# Patient Record
Sex: Female | Born: 1965 | Race: Black or African American | Hispanic: No | Marital: Single | Smoking: Never smoker
Health system: Southern US, Community
[De-identification: ages and names within clinical notes are randomized; demographics above are authoritative.]

## PROBLEM LIST (undated history)

## (undated) HISTORY — PX: BREAST EXCISIONAL BIOPSY: SUR124

---

## 1998-10-26 ENCOUNTER — Other Ambulatory Visit: Admission: RE | Admit: 1998-10-26 | Discharge: 1998-10-26 | Payer: Self-pay | Admitting: *Deleted

## 1999-11-24 ENCOUNTER — Other Ambulatory Visit: Admission: RE | Admit: 1999-11-24 | Discharge: 1999-11-24 | Payer: Self-pay | Admitting: *Deleted

## 2000-11-23 ENCOUNTER — Other Ambulatory Visit: Admission: RE | Admit: 2000-11-23 | Discharge: 2000-11-23 | Payer: Self-pay | Admitting: Obstetrics and Gynecology

## 2001-12-03 ENCOUNTER — Other Ambulatory Visit: Admission: RE | Admit: 2001-12-03 | Discharge: 2001-12-03 | Payer: Self-pay | Admitting: Obstetrics and Gynecology

## 2002-05-02 ENCOUNTER — Encounter: Admission: RE | Admit: 2002-05-02 | Discharge: 2002-05-02 | Payer: Self-pay | Admitting: Family Medicine

## 2002-05-02 ENCOUNTER — Encounter: Payer: Self-pay | Admitting: Family Medicine

## 2002-10-16 ENCOUNTER — Ambulatory Visit (HOSPITAL_COMMUNITY): Admission: RE | Admit: 2002-10-16 | Discharge: 2002-10-16 | Payer: Self-pay | Admitting: Family Medicine

## 2002-10-16 ENCOUNTER — Encounter: Payer: Self-pay | Admitting: Family Medicine

## 2002-11-21 ENCOUNTER — Encounter: Admission: RE | Admit: 2002-11-21 | Discharge: 2002-11-21 | Payer: Self-pay | Admitting: Family Medicine

## 2002-12-24 ENCOUNTER — Other Ambulatory Visit: Admission: RE | Admit: 2002-12-24 | Discharge: 2002-12-24 | Payer: Self-pay | Admitting: Obstetrics and Gynecology

## 2003-06-02 ENCOUNTER — Encounter: Admission: RE | Admit: 2003-06-02 | Discharge: 2003-06-02 | Payer: Self-pay | Admitting: Family Medicine

## 2004-03-17 ENCOUNTER — Encounter: Admission: RE | Admit: 2004-03-17 | Discharge: 2004-03-17 | Payer: Self-pay | Admitting: Family Medicine

## 2005-05-12 ENCOUNTER — Encounter: Admission: RE | Admit: 2005-05-12 | Discharge: 2005-05-12 | Payer: Self-pay | Admitting: Family Medicine

## 2006-06-29 ENCOUNTER — Encounter: Admission: RE | Admit: 2006-06-29 | Discharge: 2006-06-29 | Payer: Self-pay | Admitting: Family Medicine

## 2007-08-14 ENCOUNTER — Encounter: Admission: RE | Admit: 2007-08-14 | Discharge: 2007-08-14 | Payer: Self-pay | Admitting: Family Medicine

## 2008-08-21 ENCOUNTER — Encounter: Admission: RE | Admit: 2008-08-21 | Discharge: 2008-08-21 | Payer: Self-pay | Admitting: Family Medicine

## 2009-10-09 ENCOUNTER — Encounter: Admission: RE | Admit: 2009-10-09 | Discharge: 2009-10-09 | Payer: Self-pay | Admitting: Family Medicine

## 2009-12-16 ENCOUNTER — Emergency Department (HOSPITAL_COMMUNITY): Admission: EM | Admit: 2009-12-16 | Discharge: 2009-12-16 | Payer: Self-pay | Admitting: Emergency Medicine

## 2010-12-13 ENCOUNTER — Other Ambulatory Visit (HOSPITAL_COMMUNITY): Payer: Self-pay | Admitting: Family Medicine

## 2010-12-13 DIAGNOSIS — Z1231 Encounter for screening mammogram for malignant neoplasm of breast: Secondary | ICD-10-CM

## 2011-01-12 ENCOUNTER — Ambulatory Visit (HOSPITAL_COMMUNITY)
Admission: RE | Admit: 2011-01-12 | Discharge: 2011-01-12 | Disposition: A | Discharge status: Home / Self Care | Payer: BC Managed Care – PPO | Source: Ambulatory Visit | Attending: Family Medicine | Admitting: Family Medicine

## 2011-01-12 DIAGNOSIS — Z1231 Encounter for screening mammogram for malignant neoplasm of breast: Secondary | ICD-10-CM | POA: Insufficient documentation

## 2012-02-20 ENCOUNTER — Other Ambulatory Visit (HOSPITAL_COMMUNITY): Payer: Self-pay | Admitting: Family Medicine

## 2012-02-20 DIAGNOSIS — Z1231 Encounter for screening mammogram for malignant neoplasm of breast: Secondary | ICD-10-CM

## 2012-02-28 ENCOUNTER — Ambulatory Visit (HOSPITAL_COMMUNITY): Payer: BC Managed Care – PPO

## 2012-03-06 ENCOUNTER — Ambulatory Visit (HOSPITAL_COMMUNITY): Payer: BC Managed Care – PPO

## 2012-03-19 ENCOUNTER — Ambulatory Visit (HOSPITAL_COMMUNITY)
Admission: RE | Admit: 2012-03-19 | Discharge: 2012-03-19 | Disposition: A | Discharge status: Home / Self Care | Payer: BC Managed Care – PPO | Source: Ambulatory Visit | Attending: Family Medicine | Admitting: Family Medicine

## 2012-03-19 DIAGNOSIS — Z1231 Encounter for screening mammogram for malignant neoplasm of breast: Secondary | ICD-10-CM | POA: Insufficient documentation

## 2012-03-21 ENCOUNTER — Other Ambulatory Visit: Payer: Self-pay | Admitting: Family Medicine

## 2012-03-21 DIAGNOSIS — R928 Other abnormal and inconclusive findings on diagnostic imaging of breast: Secondary | ICD-10-CM

## 2012-04-05 ENCOUNTER — Ambulatory Visit
Admission: RE | Admit: 2012-04-05 | Discharge: 2012-04-05 | Disposition: A | Discharge status: Home / Self Care | Payer: BC Managed Care – PPO | Source: Ambulatory Visit | Attending: Family Medicine | Admitting: Family Medicine

## 2012-04-05 DIAGNOSIS — R928 Other abnormal and inconclusive findings on diagnostic imaging of breast: Secondary | ICD-10-CM

## 2012-12-28 ENCOUNTER — Other Ambulatory Visit: Payer: Self-pay | Admitting: Family Medicine

## 2012-12-28 DIAGNOSIS — R921 Mammographic calcification found on diagnostic imaging of breast: Secondary | ICD-10-CM

## 2012-12-31 ENCOUNTER — Other Ambulatory Visit (INDEPENDENT_AMBULATORY_CARE_PROVIDER_SITE_OTHER): Payer: Self-pay | Admitting: Surgery

## 2013-01-28 ENCOUNTER — Ambulatory Visit
Admission: RE | Admit: 2013-01-28 | Discharge: 2013-01-28 | Disposition: A | Discharge status: Home / Self Care | Payer: BC Managed Care – PPO | Source: Ambulatory Visit | Attending: Family Medicine | Admitting: Family Medicine

## 2013-01-28 DIAGNOSIS — R921 Mammographic calcification found on diagnostic imaging of breast: Secondary | ICD-10-CM

## 2013-08-28 ENCOUNTER — Other Ambulatory Visit: Payer: Self-pay | Admitting: Family Medicine

## 2013-08-28 DIAGNOSIS — R921 Mammographic calcification found on diagnostic imaging of breast: Secondary | ICD-10-CM

## 2013-09-03 ENCOUNTER — Encounter (INDEPENDENT_AMBULATORY_CARE_PROVIDER_SITE_OTHER): Payer: Self-pay

## 2013-09-03 ENCOUNTER — Ambulatory Visit
Admission: RE | Admit: 2013-09-03 | Discharge: 2013-09-03 | Disposition: A | Discharge status: Home / Self Care | Payer: BC Managed Care – PPO | Source: Ambulatory Visit | Attending: Family Medicine | Admitting: Family Medicine

## 2013-09-03 DIAGNOSIS — R921 Mammographic calcification found on diagnostic imaging of breast: Secondary | ICD-10-CM

## 2014-08-29 ENCOUNTER — Other Ambulatory Visit: Payer: Self-pay | Admitting: Family Medicine

## 2014-08-29 DIAGNOSIS — Z1231 Encounter for screening mammogram for malignant neoplasm of breast: Secondary | ICD-10-CM

## 2014-09-10 ENCOUNTER — Ambulatory Visit: Payer: Self-pay

## 2014-09-17 ENCOUNTER — Ambulatory Visit: Payer: Self-pay

## 2014-10-08 ENCOUNTER — Ambulatory Visit (INDEPENDENT_AMBULATORY_CARE_PROVIDER_SITE_OTHER): Payer: BLUE CROSS/BLUE SHIELD

## 2014-10-08 DIAGNOSIS — Z1231 Encounter for screening mammogram for malignant neoplasm of breast: Secondary | ICD-10-CM

## 2015-11-11 ENCOUNTER — Other Ambulatory Visit (HOSPITAL_BASED_OUTPATIENT_CLINIC_OR_DEPARTMENT_OTHER): Payer: Self-pay | Admitting: Family Medicine

## 2015-11-11 DIAGNOSIS — Z1231 Encounter for screening mammogram for malignant neoplasm of breast: Secondary | ICD-10-CM

## 2015-11-13 ENCOUNTER — Ambulatory Visit (INDEPENDENT_AMBULATORY_CARE_PROVIDER_SITE_OTHER): Payer: Managed Care, Other (non HMO)

## 2015-11-13 DIAGNOSIS — Z1231 Encounter for screening mammogram for malignant neoplasm of breast: Secondary | ICD-10-CM | POA: Diagnosis not present

## 2015-11-17 ENCOUNTER — Other Ambulatory Visit: Payer: Self-pay | Admitting: Family Medicine

## 2015-11-17 DIAGNOSIS — R928 Other abnormal and inconclusive findings on diagnostic imaging of breast: Secondary | ICD-10-CM

## 2015-11-23 ENCOUNTER — Ambulatory Visit
Admission: RE | Admit: 2015-11-23 | Discharge: 2015-11-23 | Disposition: A | Discharge status: Home / Self Care | Payer: Managed Care, Other (non HMO) | Source: Ambulatory Visit | Attending: Family Medicine | Admitting: Family Medicine

## 2015-11-23 ENCOUNTER — Ambulatory Visit: Admission: RE | Admit: 2015-11-23 | Payer: Managed Care, Other (non HMO) | Source: Ambulatory Visit

## 2015-11-23 ENCOUNTER — Other Ambulatory Visit: Payer: Self-pay | Admitting: Family Medicine

## 2015-11-23 DIAGNOSIS — R928 Other abnormal and inconclusive findings on diagnostic imaging of breast: Secondary | ICD-10-CM

## 2015-11-23 DIAGNOSIS — N6310 Unspecified lump in the right breast, unspecified quadrant: Secondary | ICD-10-CM

## 2015-11-25 HISTORY — PX: BREAST BIOPSY: SHX20

## 2015-11-26 ENCOUNTER — Ambulatory Visit
Admission: RE | Admit: 2015-11-26 | Discharge: 2015-11-26 | Disposition: A | Discharge status: Home / Self Care | Payer: Managed Care, Other (non HMO) | Source: Ambulatory Visit | Attending: Family Medicine | Admitting: Family Medicine

## 2015-11-26 ENCOUNTER — Other Ambulatory Visit: Payer: Self-pay | Admitting: Family Medicine

## 2015-11-26 DIAGNOSIS — N6310 Unspecified lump in the right breast, unspecified quadrant: Secondary | ICD-10-CM

## 2016-03-22 ENCOUNTER — Ambulatory Visit
Admission: RE | Admit: 2016-03-22 | Discharge: 2016-03-22 | Disposition: A | Discharge status: Home / Self Care | Payer: Managed Care, Other (non HMO) | Source: Ambulatory Visit | Attending: Family Medicine | Admitting: Family Medicine

## 2016-03-22 ENCOUNTER — Other Ambulatory Visit: Payer: Self-pay | Admitting: Family Medicine

## 2016-03-22 DIAGNOSIS — R059 Cough, unspecified: Secondary | ICD-10-CM

## 2016-03-22 DIAGNOSIS — R05 Cough: Secondary | ICD-10-CM

## 2016-06-03 ENCOUNTER — Ambulatory Visit (INDEPENDENT_AMBULATORY_CARE_PROVIDER_SITE_OTHER): Payer: Managed Care, Other (non HMO) | Admitting: Internal Medicine

## 2016-06-03 DIAGNOSIS — R05 Cough: Secondary | ICD-10-CM | POA: Diagnosis not present

## 2016-06-03 DIAGNOSIS — R053 Chronic cough: Secondary | ICD-10-CM | POA: Insufficient documentation

## 2016-06-03 LAB — NITRIC OXIDE: Nitric Oxide: 18

## 2016-06-03 NOTE — Patient Instructions (Signed)
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  - any problems call or come sooner - at followup if still probelmatic we can consider speech rehab or neurontin or cough research protocol

## 2016-06-03 NOTE — Progress Notes (Signed)
Subjective:    Patient ID: Sharon Boyer, female    DOB: 1966/01/12, 51 y.o.   MRN: [id number]  PCP Merri BrunetteSmith, Candace, MD   HPI  IOV 06/03/2016  Chief Complaint  Patient presents with  . Pulmonary Consult    Referring Dr. Severiano Gilbertandice Smith patient is having a on going cough.    Sharon Boyer 1966/01/12 [address]j [address]  [place] [id number] has been referred for evaluation of chronic cough. Patient tells me that in May 2017 she suffered from a respiratory infection and since then cough. Cough is severe and dry and she made this appointment but in the past 2 weeks cough suddenly improed and cough is now mild. Cough made worse by talking (she works in [employer] at [employer]) and improved by being quiet, and drinking water.No allergies. No ace ihnhibitor use. No wheee. No dyspnea. RSI cough score 1 month average is 23 but 1 weeks average is 5. She feels much better and wonder why she is here. Acording to the primary care physician he' had chronic couh that has not reslved with TessalonPerles, allergy mdicatins , hydrocodone cough syrup , Flonase , oral anti-allergy tablets. She denies GERD. She is inclined to very simple Rx measures only    exhaled nitric oxide today's 18 ppb and normal   She had chest -ray 03/18/2016 that I personlly visualized and is clear (CT chest 2004 - clear)   cough related past medical history -  Primary carr notes from 04/27/2016 reviewed primary care physician as tated that patienthas a istory of dssion and anxety o whi hisn Zooft in the past and alprazolam. Primary care physician is noted history of hyprtension patient being on atenolol is not on ACE inhibitor   lab values from 04/27/2016 shows a creatinine of 0.8 Mg percen abnormal liver function tests    Dr Gretta CoolKouffman Reflux Symptom Index (> 13-15 suggestive of LPR cough) 0 -> 5  =  none ->severe problem  Hoarseness of problem with voice 4  Clearing  Of Throat 4  Excess throat mucus or feeling  of post nasal drip 4  Difficulty swallowing food, liquid or tablets 0  Cough after eating or lying down 4  Breathing difficulties or choking episodes 1  Troublesome or annoying cough 4  Sensation of something sticking in throat or lump in throat 0  Heartburn, chest pain, indigestion, or stomach acid coming up 2  TOTAL 23      has no past medical history on file.   has no tobacco history on file.  No past surgical history on file.  No Known Allergies  Immunization History  Administered Date(s) Administered  . Influenza, High Dose Seasonal PF 10/25/2015    No family history on file.   Current Outpatient Prescriptions:  .  atenolol-chlorthalidone (TENORETIC) 50-25 MG tablet, 1 TABLET ONCE A DAY ORALLY 30 DAYS, Disp: , Rfl: 6 .  benzonatate (TESSALON) 100 MG capsule, TAKE 1 TO 2 CAPSULES BY MOUTH EVERY 8 TO 12 HOURS AS NEEDED FOR COUGH, Disp: , Rfl: 1 .  KLOR-CON 8 MEQ tablet, TAKE 3 TABLETS EVERY DAY, Disp: , Rfl: 1 .  loratadine (CLARITIN) 10 MG tablet, 1 TABLET ONCE A DAY ORALLY 30 DAY(S), Disp: , Rfl: 0 .  zolpidem (AMBIEN) 10 MG tablet, Take 10 mg by mouth at bedtime., Disp: , Rfl: 5   Review of Systems  Constitutional: Negative for fever and unexpected weight change.  HENT: Negative for congestion, dental problem, ear  pain, nosebleeds, postnasal drip, rhinorrhea, sinus pressure, sneezing, sore throat and trouble swallowing.   Eyes: Negative for redness and itching.  Respiratory: Positive for cough. Negative for chest tightness, shortness of breath and wheezing.   Cardiovascular: Negative for palpitations and leg swelling.  Gastrointestinal: Negative for nausea and vomiting.  Genitourinary: Negative for dysuria.  Musculoskeletal: Negative for joint swelling.  Skin: Negative for rash.  Allergic/Immunologic: Negative.  Negative for environmental allergies, food allergies and immunocompromised state.  Neurological: Negative for headaches.  Hematological: Does not  bruise/bleed easily.  Psychiatric/Behavioral: Negative for dysphoric mood. The patient is not nervous/anxious.        Objective:   Physical Exam  Constitutional: She is oriented to person, place, and time. She appears well-developed and well-nourished. No distress.  HENT:  Head: Normocephalic and atraumatic.  Right Ear: External ear normal.  Left Ear: External ear normal.  Mouth/Throat: Oropharynx is clear and moist. No oropharyngeal exudate.  Eyes: Conjunctivae and EOM are normal. Pupils are equal, round, and reactive to light. Right eye exhibits no discharge. Left eye exhibits no discharge. No scleral icterus.  Neck: Normal range of motion. Neck supple. No JVD present. No tracheal deviation present. No thyromegaly present.  Cardiovascular: Normal rate, regular rhythm, normal heart sounds and intact distal pulses.  Exam reveals no gallop and no friction rub.   No murmur heard. Pulmonary/Chest: Effort normal and breath sounds normal. No respiratory distress. She has no wheezes. She has no rales. She exhibits no tenderness.  Abdominal: Soft. Bowel sounds are normal. She exhibits no distension and no mass. There is no tenderness. There is no rebound and no guarding.  Musculoskeletal: Normal range of motion. She exhibits no edema or tenderness.  Lymphadenopathy:    She has no cervical adenopathy.  Neurological: She is alert and oriented to person, place, and time. She has normal reflexes. No cranial nerve deficit. She exhibits normal muscle tone. Coordination normal.  Skin: Skin is warm and dry. No rash noted. She is not diaphoretic. No erythema. No pallor.  Psychiatric: She has a normal mood and affect. Her behavior is normal. Judgment and thought content normal.  Vitals reviewed.   Vitals:   06/03/16 1410  BP: 140/82  Pulse: 100  Resp: 16  SpO2: 100%  Weight: 115 lb (52.2 kg)  Height: 5\' 1"  (1.549 m)    Estimated body mass index is 21.73 kg/m as calculated from the following:    Height as of this encounter: 5\' 1"  (1.549 m).   Weight as of this encounter: 115 lb (52.2 kg).       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  - any problems call or come sooner - at followup if still probelmatic we can consider speech rehab or neurontin or cough research protocol     Dr. Kalman Shan, M.D., [facility].C.P Pulmonary and Critical Care Medicine Staff Physician [facility] Pager: [phone], If no answer or between  15:00h - 7:00h: call [phone]  06/03/2016 2:46 PM

## 2016-06-03 NOTE — Assessment & Plan Note (Signed)
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  - any problems call or come sooner - at followup if still probelmatic we can consider speech rehab or neurontin or cough research protocol

## 2016-08-18 ENCOUNTER — Ambulatory Visit: Payer: Managed Care, Other (non HMO) | Admitting: Internal Medicine

## 2016-10-12 ENCOUNTER — Ambulatory Visit: Payer: Managed Care, Other (non HMO) | Admitting: Internal Medicine

## 2016-11-08 ENCOUNTER — Other Ambulatory Visit: Payer: Self-pay | Admitting: Family Medicine

## 2016-11-08 DIAGNOSIS — Z1231 Encounter for screening mammogram for malignant neoplasm of breast: Secondary | ICD-10-CM

## 2016-11-10 ENCOUNTER — Other Ambulatory Visit: Payer: Self-pay | Admitting: Family Medicine

## 2016-11-10 DIAGNOSIS — N644 Mastodynia: Secondary | ICD-10-CM

## 2016-11-18 ENCOUNTER — Ambulatory Visit
Admission: RE | Admit: 2016-11-18 | Discharge: 2016-11-18 | Disposition: A | Discharge status: Home / Self Care | Payer: Managed Care, Other (non HMO) | Source: Ambulatory Visit | Attending: Family Medicine | Admitting: Family Medicine

## 2016-11-18 DIAGNOSIS — N644 Mastodynia: Secondary | ICD-10-CM

## 2016-12-05 ENCOUNTER — Ambulatory Visit: Payer: Managed Care, Other (non HMO) | Admitting: Internal Medicine

## 2018-01-01 ENCOUNTER — Other Ambulatory Visit: Payer: Self-pay | Admitting: Family Medicine

## 2018-01-01 DIAGNOSIS — Z1231 Encounter for screening mammogram for malignant neoplasm of breast: Secondary | ICD-10-CM

## 2018-02-09 ENCOUNTER — Ambulatory Visit
Admission: RE | Admit: 2018-02-09 | Discharge: 2018-02-09 | Disposition: A | Discharge status: Home / Self Care | Payer: BLUE CROSS/BLUE SHIELD | Source: Ambulatory Visit | Attending: Family Medicine | Admitting: Family Medicine

## 2018-02-09 DIAGNOSIS — Z1231 Encounter for screening mammogram for malignant neoplasm of breast: Secondary | ICD-10-CM | POA: Diagnosis not present

## 2018-02-15 DIAGNOSIS — R252 Cramp and spasm: Secondary | ICD-10-CM | POA: Diagnosis not present

## 2018-03-15 DIAGNOSIS — Z1211 Encounter for screening for malignant neoplasm of colon: Secondary | ICD-10-CM | POA: Diagnosis not present

## 2018-03-15 DIAGNOSIS — R7303 Prediabetes: Secondary | ICD-10-CM | POA: Diagnosis not present

## 2018-03-15 DIAGNOSIS — I10 Essential (primary) hypertension: Secondary | ICD-10-CM | POA: Diagnosis not present

## 2018-03-15 DIAGNOSIS — G4700 Insomnia, unspecified: Secondary | ICD-10-CM | POA: Diagnosis not present

## 2018-03-15 DIAGNOSIS — F419 Anxiety disorder, unspecified: Secondary | ICD-10-CM | POA: Diagnosis not present

## 2018-08-30 DIAGNOSIS — F419 Anxiety disorder, unspecified: Secondary | ICD-10-CM | POA: Diagnosis not present

## 2018-08-30 DIAGNOSIS — I10 Essential (primary) hypertension: Secondary | ICD-10-CM | POA: Diagnosis not present

## 2018-08-30 DIAGNOSIS — G4700 Insomnia, unspecified: Secondary | ICD-10-CM | POA: Diagnosis not present

## 2018-08-30 DIAGNOSIS — R7303 Prediabetes: Secondary | ICD-10-CM | POA: Diagnosis not present

## 2018-08-30 DIAGNOSIS — Z0000 Encounter for general adult medical examination without abnormal findings: Secondary | ICD-10-CM | POA: Diagnosis not present

## 2019-03-15 ENCOUNTER — Other Ambulatory Visit: Payer: Self-pay | Admitting: Family Medicine

## 2019-03-15 DIAGNOSIS — Z1231 Encounter for screening mammogram for malignant neoplasm of breast: Secondary | ICD-10-CM

## 2019-03-25 DIAGNOSIS — G4700 Insomnia, unspecified: Secondary | ICD-10-CM | POA: Diagnosis not present

## 2019-03-25 DIAGNOSIS — F419 Anxiety disorder, unspecified: Secondary | ICD-10-CM | POA: Diagnosis not present

## 2019-03-25 DIAGNOSIS — R7303 Prediabetes: Secondary | ICD-10-CM | POA: Diagnosis not present

## 2019-03-25 DIAGNOSIS — I10 Essential (primary) hypertension: Secondary | ICD-10-CM | POA: Diagnosis not present

## 2019-04-12 ENCOUNTER — Ambulatory Visit: Payer: BC Managed Care – PPO | Admitting: Podiatry

## 2019-04-15 ENCOUNTER — Ambulatory Visit
Admission: RE | Admit: 2019-04-15 | Discharge: 2019-04-15 | Disposition: A | Discharge status: Home / Self Care | Payer: BC Managed Care – PPO | Source: Ambulatory Visit | Attending: Family Medicine | Admitting: Family Medicine

## 2019-04-15 ENCOUNTER — Other Ambulatory Visit: Payer: Self-pay

## 2019-04-15 DIAGNOSIS — Z1231 Encounter for screening mammogram for malignant neoplasm of breast: Secondary | ICD-10-CM | POA: Diagnosis not present

## 2019-04-16 ENCOUNTER — Ambulatory Visit (INDEPENDENT_AMBULATORY_CARE_PROVIDER_SITE_OTHER): Payer: BC Managed Care – PPO | Admitting: Sports Medicine

## 2019-04-16 ENCOUNTER — Encounter: Payer: Self-pay | Admitting: Sports Medicine

## 2019-04-16 DIAGNOSIS — M79675 Pain in left toe(s): Secondary | ICD-10-CM | POA: Diagnosis not present

## 2019-04-16 DIAGNOSIS — B351 Tinea unguium: Secondary | ICD-10-CM

## 2019-04-16 DIAGNOSIS — L608 Other nail disorders: Secondary | ICD-10-CM | POA: Diagnosis not present

## 2019-04-16 DIAGNOSIS — M79674 Pain in right toe(s): Secondary | ICD-10-CM

## 2019-04-16 DIAGNOSIS — B359 Dermatophytosis, unspecified: Secondary | ICD-10-CM | POA: Diagnosis not present

## 2019-04-16 DIAGNOSIS — L601 Onycholysis: Secondary | ICD-10-CM | POA: Diagnosis not present

## 2019-04-16 MED ORDER — CLOTRIMAZOLE 1 % EX SOLN: 1.0000 "application " | Freq: Two times a day (BID) | CUTANEOUS | 5 refills | Status: DC

## 2019-04-16 MED ORDER — LAMISIL AT SPRAY 1 % EX SOLN: CUTANEOUS | 1 refills | Status: DC

## 2019-04-16 NOTE — Progress Notes (Signed)
Subjective: Sharon Boyer is a 54 y.o. female patient seen today in office with complaint of mildly painful thickened and discolored nails. Patient is desiring treatment for nail changes; has tried OTC topicals/home remedies and Vicks vapor rub in the past with no improvement however does admit that she has not been consistent with any of these remedies. Reports that nails are becoming difficult to manage because of the thickness and scaling skin to both feet that has been going on for several months/year. Patient has no other pedal complaints at this time.  Review of Systems  All other systems reviewed and are negative.    Patient Active Problem List   Diagnosis Date Noted  . Chronic cough 06/03/2016    Current Outpatient Medications on File Prior to Visit  Medication Sig Dispense Refill  . ALPRAZolam (XANAX) 0.25 MG tablet Take 0.25 mg by mouth 2 (two) times daily as needed.    Marland Kitchen amLODipine (NORVASC) 10 MG tablet Take 10 mg by mouth daily.    Marland Kitchen atenolol-chlorthalidone (TENORETIC) 50-25 MG tablet 1 TABLET ONCE A DAY ORALLY 30 DAYS  6  . carvedilol (COREG) 12.5 MG tablet Take 12.5 mg by mouth 2 (two) times daily.    Marland Kitchen loratadine (CLARITIN) 10 MG tablet 1 TABLET ONCE A DAY ORALLY 30 DAY(S)  0  . olmesartan (BENICAR) 40 MG tablet Take 40 mg by mouth daily.    Marland Kitchen zolpidem (AMBIEN) 10 MG tablet Take 10 mg by mouth at bedtime.  5  . benzonatate (TESSALON) 100 MG capsule TAKE 1 TO 2 CAPSULES BY MOUTH EVERY 8 TO 12 HOURS AS NEEDED FOR COUGH  1  . KLOR-CON 8 MEQ tablet TAKE 3 TABLETS EVERY DAY  1   No current facility-administered medications on file prior to visit.    No Known Allergies  Objective: Physical Exam  General: Well developed, nourished, no acute distress, awake, alert and oriented x 3  Vascular: Dorsalis pedis artery 2/4 bilateral, Posterior tibial artery 2/4 bilateral, skin temperature warm to warm proximal to distal bilateral lower extremities, no varicosities, pedal  hair present bilateral.  Neurological: Gross sensation present via light touch bilateral.   Dermatological: Skin is warm, dry, and supple bilateral, Nails 1-10 are tender, short thick, and discolored with mild subungal debris with bilateral hallux nail most involved with distal lifting, + fourth webspace macerations present bilateral, scaly skin to use plantar surfaces of both feet, no open lesions present bilateral, no callus/corns/hyperkeratotic tissue present bilateral. No other signs of infection bilateral.  Musculoskeletal: Asymptomatic hammertoe boney deformities noted bilateral. Muscular strength within normal limits without painon range of motion. No pain with calf compression bilateral.  Assessment and Plan:  Problem List Items Addressed This Visit    None    Visit Diagnoses    Toenail fungus    -  Primary   Relevant Medications   clotrimazole (LOTRIMIN) 1 % external solution   Terbinafine HCl (LAMISIL AT SPRAY) 1 % SOLN   Other Relevant Orders   Fungus culture w smear   Toe pain, bilateral       Tinea       Relevant Medications   clotrimazole (LOTRIMIN) 1 % external solution   Terbinafine HCl (LAMISIL AT SPRAY) 1 % SOLN      -Examined patient -Discussed treatment options for painful dystrophic nails and tinea skin -Prescribed clotrimazole solution to use in between toes twice daily and Lamisil spray to use at bedtime -Fungal culture was obtained by removing a portion of  the hard nail itself from each of the involved toenails using a sterile nail nipper and sent to [facility] lab. Patient tolerated the biopsy procedure well without discomfort or need for anesthesia.  -Advised good hygiene habits and to dry well in between toes to prevent excessive moisture -Patient to return in 4 weeks for follow up evaluation and discussion of fungal culture results and medication check or sooner if symptoms worsen.  Landis Martins, DPM

## 2019-05-14 ENCOUNTER — Other Ambulatory Visit: Payer: Self-pay | Admitting: Sports Medicine

## 2019-05-14 ENCOUNTER — Other Ambulatory Visit: Payer: Self-pay

## 2019-05-14 ENCOUNTER — Ambulatory Visit (INDEPENDENT_AMBULATORY_CARE_PROVIDER_SITE_OTHER): Payer: BC Managed Care – PPO | Admitting: Sports Medicine

## 2019-05-14 ENCOUNTER — Encounter: Payer: Self-pay | Admitting: Sports Medicine

## 2019-05-14 VITALS — Temp 97.7°F

## 2019-05-14 DIAGNOSIS — M79675 Pain in left toe(s): Secondary | ICD-10-CM | POA: Diagnosis not present

## 2019-05-14 DIAGNOSIS — B359 Dermatophytosis, unspecified: Secondary | ICD-10-CM

## 2019-05-14 DIAGNOSIS — M79674 Pain in right toe(s): Secondary | ICD-10-CM | POA: Diagnosis not present

## 2019-05-14 DIAGNOSIS — B351 Tinea unguium: Secondary | ICD-10-CM

## 2019-05-14 LAB — HEPATIC FUNCTION PANEL
AG Ratio: 1.5 (calc) (ref 1.0–2.5)
ALT: 14 U/L (ref 6–29)
AST: 16 U/L (ref 10–35)
Albumin: 4.7 g/dL (ref 3.6–5.1)
Alkaline phosphatase (APISO): 101 U/L (ref 37–153)
Bilirubin, Direct: 0.1 mg/dL (ref 0.0–0.2)
Globulin: 3.2 g/dL (calc) (ref 1.9–3.7)
Indirect Bilirubin: 0.3 mg/dL (calc) (ref 0.2–1.2)
Total Bilirubin: 0.4 mg/dL (ref 0.2–1.2)
Total Protein: 7.9 g/dL (ref 6.1–8.1)

## 2019-05-14 MED ORDER — TERBINAFINE HCL 250 MG PO TABS: 250.0000 mg | ORAL_TABLET | Freq: Every day | ORAL | 0 refills | Status: DC

## 2019-05-14 NOTE — Progress Notes (Signed)
LFT normal Lamisil sent to pharmacy _Dr. Marylene Land

## 2019-05-14 NOTE — Progress Notes (Signed)
  Subjective: Sharon Boyer is a 54 y.o. female patient seen today in office for fungal culture results. Patient has no other pedal complaints at this time.   Patient Active Problem List   Diagnosis Date Noted  . Chronic cough 06/03/2016    Current Outpatient Medications on File Prior to Visit  Medication Sig Dispense Refill  . ALPRAZolam (XANAX) 0.25 MG tablet Take 0.25 mg by mouth 2 (two) times daily as needed.    Marland Kitchen amLODipine (NORVASC) 10 MG tablet Take 10 mg by mouth daily.    . carvedilol (COREG) 12.5 MG tablet Take 12.5 mg by mouth 2 (two) times daily.    . clotrimazole (LOTRIMIN) 1 % external solution Apply 1 application topically 2 (two) times daily. In between toes 60 mL 5  . olmesartan (BENICAR) 40 MG tablet Take 40 mg by mouth daily.    . Terbinafine HCl (LAMISIL AT SPRAY) 1 % SOLN Spray daily to the tops and bottoms of both feet at bedtime 125 mL 1  . zolpidem (AMBIEN) 10 MG tablet Take 10 mg by mouth at bedtime.  5   No current facility-administered medications on file prior to visit.    No Known Allergies  Objective: Physical Exam  General: Well developed, nourished, no acute distress, awake, alert and oriented x 3   Vascular: Dorsalis pedis artery 2/4 bilateral, Posterior tibial artery 2/4 bilateral, skin temperature warm to warm proximal to distal bilateral lower extremities, no varicosities, pedal hair present bilateral.   Neurological: Gross sensation present via light touch bilateral.    Dermatological: Skin is warm, dry, and supple bilateral, Nails 1-10 are tender, short thick, and discolored with mild subungal debris with bilateral hallux nail most involved with distal lifting, there is less involvement bilateral third toenails. + fourth webspace macerations present bilateral, scaly skin to use plantar surfaces of both feet that appears to be much improved, no open lesions present bilateral, no callus/corns/hyperkeratotic tissue present bilateral. No other  signs of infection bilateral.   Musculoskeletal: Asymptomatic hammertoe boney deformities noted bilateral. Muscular strength within normal limits without painon range of motion. No pain with calf compression bilateral.    Fungal culture positive significant for T rubrum and Pseudomonas and evidence of microtrauma  Assessment and Plan:  Problem List Items Addressed This Visit    None    Visit Diagnoses    Nail fungus    -  Primary   Relevant Orders   Hepatic Function Panel   Toenail fungus       Toe pain, bilateral       Tinea          -Examined patient -Discussed treatment options for painful mycotic nails -Patient opt for oral Lamisil with full understanding of medication risks; ordered LFTs for review if within normal limits will proceed with sending Rx to pharmacy for lamisil 250mg  PO daily. Anticipate 12 week course.  -Advised good hygiene habits and good supportive shoes daily for foot type -Patient to return in 6 weeks for follow up evaluation or sooner if symptoms worsen.  , DPM

## 2019-06-12 DIAGNOSIS — L509 Urticaria, unspecified: Secondary | ICD-10-CM | POA: Diagnosis not present

## 2019-06-25 ENCOUNTER — Ambulatory Visit (INDEPENDENT_AMBULATORY_CARE_PROVIDER_SITE_OTHER): Payer: BC Managed Care – PPO | Admitting: Sports Medicine

## 2019-06-25 ENCOUNTER — Other Ambulatory Visit: Payer: Self-pay

## 2019-06-25 ENCOUNTER — Encounter: Payer: Self-pay | Admitting: Sports Medicine

## 2019-06-25 VITALS — Temp 97.2°F

## 2019-06-25 DIAGNOSIS — M79674 Pain in right toe(s): Secondary | ICD-10-CM | POA: Diagnosis not present

## 2019-06-25 DIAGNOSIS — B359 Dermatophytosis, unspecified: Secondary | ICD-10-CM

## 2019-06-25 DIAGNOSIS — B351 Tinea unguium: Secondary | ICD-10-CM | POA: Diagnosis not present

## 2019-06-25 DIAGNOSIS — M79675 Pain in left toe(s): Secondary | ICD-10-CM

## 2019-06-25 LAB — HEPATIC FUNCTION PANEL
AG Ratio: 1.5 (calc) (ref 1.0–2.5)
ALT: 15 U/L (ref 6–29)
AST: 15 U/L (ref 10–35)
Albumin: 4.8 g/dL (ref 3.6–5.1)
Alkaline phosphatase (APISO): 92 U/L (ref 37–153)
Bilirubin, Direct: 0.1 mg/dL (ref 0.0–0.2)
Globulin: 3.1 g/dL (calc) (ref 1.9–3.7)
Indirect Bilirubin: 0.3 mg/dL (calc) (ref 0.2–1.2)
Total Bilirubin: 0.4 mg/dL (ref 0.2–1.2)
Total Protein: 7.9 g/dL (ref 6.1–8.1)

## 2019-06-25 NOTE — Progress Notes (Signed)
Subjective: Sharon Boyer is a 54 y.o. female patient seen today in office for follow up evaluation of nail fungus on Lamisil. Patient states that she is doing well with no adverse reaction, starting to see that the nails do not look as dark. Patient has no other pedal complaints at this time.   Patient Active Problem List   Diagnosis Date Noted   Chronic cough 06/03/2016    Current Outpatient Medications on File Prior to Visit  Medication Sig Dispense Refill   ALPRAZolam (XANAX) 0.25 MG tablet Take 0.25 mg by mouth 2 (two) times daily as needed.     amLODipine (NORVASC) 10 MG tablet Take 10 mg by mouth daily.     carvedilol (COREG) 12.5 MG tablet Take 12.5 mg by mouth 2 (two) times daily.     clotrimazole (LOTRIMIN) 1 % external solution Apply 1 application topically 2 (two) times daily. In between toes 60 mL 5   olmesartan (BENICAR) 40 MG tablet Take 40 mg by mouth daily.     terbinafine (LAMISIL) 250 MG tablet Take 1 tablet (250 mg total) by mouth daily. 90 tablet 0   Terbinafine HCl (LAMISIL AT SPRAY) 1 % SOLN Spray daily to the tops and bottoms of both feet at bedtime 125 mL 1   zolpidem (AMBIEN) 10 MG tablet Take 10 mg by mouth at bedtime.  5   No current facility-administered medications on file prior to visit.    No Known Allergies  Objective: Physical Exam  General: Well developed, nourished, no acute distress, awake, alert and oriented x 3  Vascular: Dorsalis pedis artery 2/4 bilateral, Posterior tibial artery 2/4 bilateral, skin temperature warm to warm proximal to distal bilateral lower extremities, no varicosities, pedal hair present bilateral.  Neurological: Gross sensation present via light touch bilateral.   Dermatological: Skin is warm, dry, and supple bilateral, Nails 1-10 are tender, short thick, and discolored with mild subungal debris and early clearance noted at proximal nail bed, resolved webspace macerations present bilateral, + scaly skin, no  open lesions present bilateral, no callus/corns/hyperkeratotic tissue present bilateral. No signs of infection bilateral.  Musculoskeletal: Asymptomatic  Pes planus boney deformities noted bilateral. Muscular strength within normal limits without painon range of motion. No pain with calf compression bilateral.  Assessment and Plan:  Problem List Items Addressed This Visit    None    Visit Diagnoses    Nail fungus    -  Primary   Relevant Orders   Hepatic Function Panel   Toe pain, bilateral       Tinea         -Examined patient -Cont with Lamisil; a new set of LFTs were ordered; will call patient to stop medication if abnormal  -Cont Lamisil spray to both feet and in between toes  -Advised good hygiene habits and educated patient on proper foot care to prevent re-infection -Patient to return in 6 weeks for follow up evaluation or sooner if symptoms worsen.  Asencion Islam, DPM

## 2019-08-06 ENCOUNTER — Other Ambulatory Visit: Payer: Self-pay

## 2019-08-06 ENCOUNTER — Ambulatory Visit (INDEPENDENT_AMBULATORY_CARE_PROVIDER_SITE_OTHER): Payer: BC Managed Care – PPO | Admitting: Sports Medicine

## 2019-08-06 ENCOUNTER — Encounter: Payer: Self-pay | Admitting: Sports Medicine

## 2019-08-06 DIAGNOSIS — B351 Tinea unguium: Secondary | ICD-10-CM | POA: Diagnosis not present

## 2019-08-06 DIAGNOSIS — M79674 Pain in right toe(s): Secondary | ICD-10-CM

## 2019-08-06 DIAGNOSIS — B359 Dermatophytosis, unspecified: Secondary | ICD-10-CM | POA: Diagnosis not present

## 2019-08-06 DIAGNOSIS — M79675 Pain in left toe(s): Secondary | ICD-10-CM

## 2019-08-06 NOTE — Progress Notes (Signed)
Subjective: Sharon Boyer is a 54 y.o. female patient seen today in office for follow up evaluation of nail fungus on Lamisil. Patient states that she is doing well with no adverse reaction but did have one episode of hives but thinks it may be a reaction to something else and is going to see dermatologist. Patient has no other pedal complaints at this time.   Patient Active Problem List   Diagnosis Date Noted   Chronic cough 06/03/2016    Current Outpatient Medications on File Prior to Visit  Medication Sig Dispense Refill   ALPRAZolam (XANAX) 0.25 MG tablet Take 0.25 mg by mouth 2 (two) times daily as needed.     amLODipine (NORVASC) 10 MG tablet Take 10 mg by mouth daily.     carvedilol (COREG) 12.5 MG tablet Take 12.5 mg by mouth 2 (two) times daily.     clotrimazole (LOTRIMIN) 1 % external solution Apply 1 application topically 2 (two) times daily. In between toes 60 mL 5   olmesartan (BENICAR) 40 MG tablet Take 40 mg by mouth daily.     predniSONE (DELTASONE) 20 MG tablet Take by mouth.     terbinafine (LAMISIL) 250 MG tablet Take 1 tablet (250 mg total) by mouth daily. 90 tablet 0   Terbinafine HCl (LAMISIL AT SPRAY) 1 % SOLN Spray daily to the tops and bottoms of both feet at bedtime 125 mL 1   zolpidem (AMBIEN) 10 MG tablet Take 10 mg by mouth at bedtime.  5   No current facility-administered medications on file prior to visit.    No Known Allergies  Objective: Physical Exam  General: Well developed, nourished, no acute distress, awake, alert and oriented x 3  Vascular: Dorsalis pedis artery 2/4 bilateral, Posterior tibial artery 2/4 bilateral, skin temperature warm to warm proximal to distal bilateral lower extremities, no varicosities, pedal hair present bilateral.  Neurological: Gross sensation present via light touch bilateral.   Dermatological: Skin is warm, dry, and supple bilateral, Nails 1-10 are tender, short thick, and discolored with mild subungal  debris and early clearance noted at proximal nail bed especially bilateral hallux, resolved webspace macerations present bilateral, + scaly skin much improved from prior, no open lesions present bilateral, no callus/corns/hyperkeratotic tissue present bilateral. No signs of infection bilateral.  Musculoskeletal: Asymptomatic  Pes planus boney deformities noted bilateral. Muscular strength within normal limits without painon range of motion. No pain with calf compression bilateral.  Assessment and Plan:  Problem List Items Addressed This Visit    None    Visit Diagnoses    Nail fungus    -  Primary   Toe pain, bilateral       Tinea         -Examined patient -Cont with Lamisil until completed no additional blood work is needed since patient will be finished within a week with the medication -Cont Lamisil spray to both feet and in between toes like previous -Advised good hygiene habits and educated patient on proper foot care to prevent re-infection -Advised topical tea tree oil or biotin -Patient to return as needed or sooner if symptoms worsen.  Asencion Islam, DPM

## 2019-09-03 DIAGNOSIS — I10 Essential (primary) hypertension: Secondary | ICD-10-CM | POA: Diagnosis not present

## 2019-09-03 DIAGNOSIS — Z0000 Encounter for general adult medical examination without abnormal findings: Secondary | ICD-10-CM | POA: Diagnosis not present

## 2019-09-03 DIAGNOSIS — R7303 Prediabetes: Secondary | ICD-10-CM | POA: Diagnosis not present

## 2019-09-03 DIAGNOSIS — Z23 Encounter for immunization: Secondary | ICD-10-CM | POA: Diagnosis not present

## 2019-09-03 DIAGNOSIS — F419 Anxiety disorder, unspecified: Secondary | ICD-10-CM | POA: Diagnosis not present

## 2019-09-03 DIAGNOSIS — E7800 Pure hypercholesterolemia, unspecified: Secondary | ICD-10-CM | POA: Diagnosis not present

## 2019-12-16 DIAGNOSIS — S46911A Strain of unspecified muscle, fascia and tendon at shoulder and upper arm level, right arm, initial encounter: Secondary | ICD-10-CM | POA: Diagnosis not present

## 2020-02-05 IMAGING — MG DIGITAL SCREENING BILATERAL MAMMOGRAM WITH TOMO AND CAD
8 series · 9 of 24 positions shown · non-contrast
Comparison: Previous exam(s).

CLINICAL DATA: Screening.

EXAM:
DIGITAL SCREENING BILATERAL MAMMOGRAM WITH TOMO AND CAD

[L CC synth-2D]
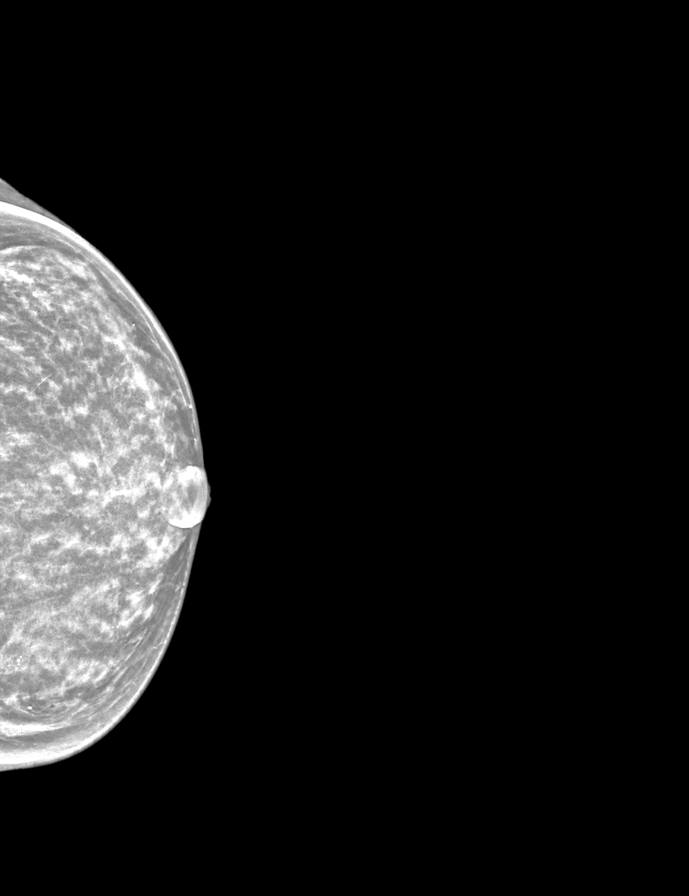

[R MLO synth-2D]
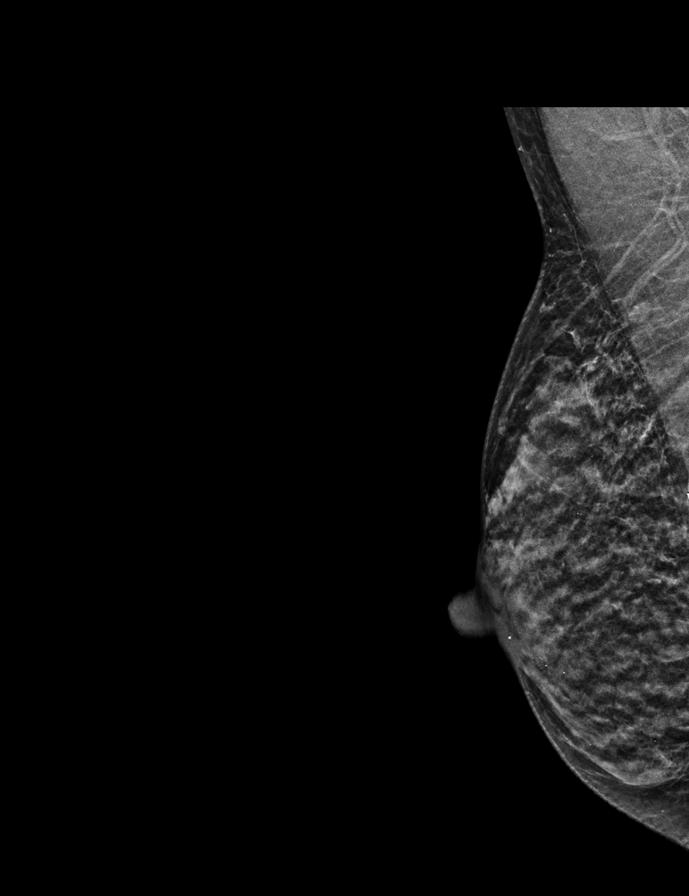

[R CC synth-2D]
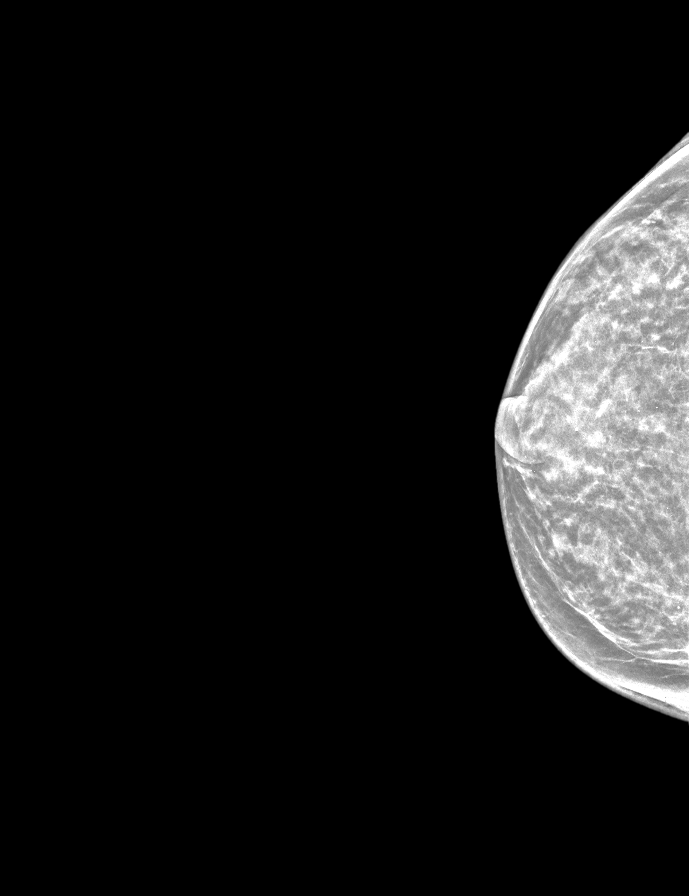

[L MLO synth-2D]
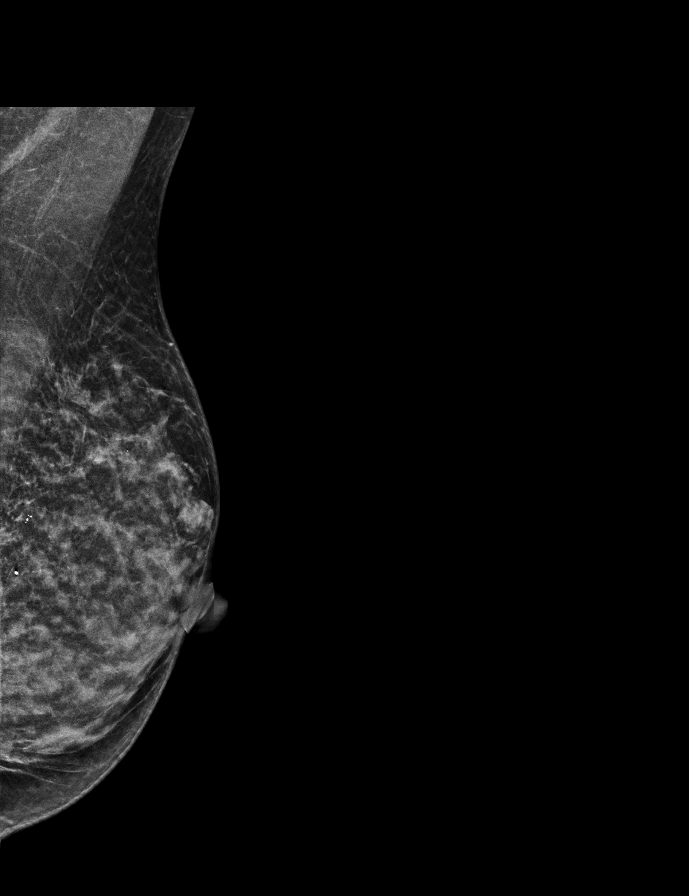

[R MLO tomo · 2 of 38 frames shown]
[frame 13/38]
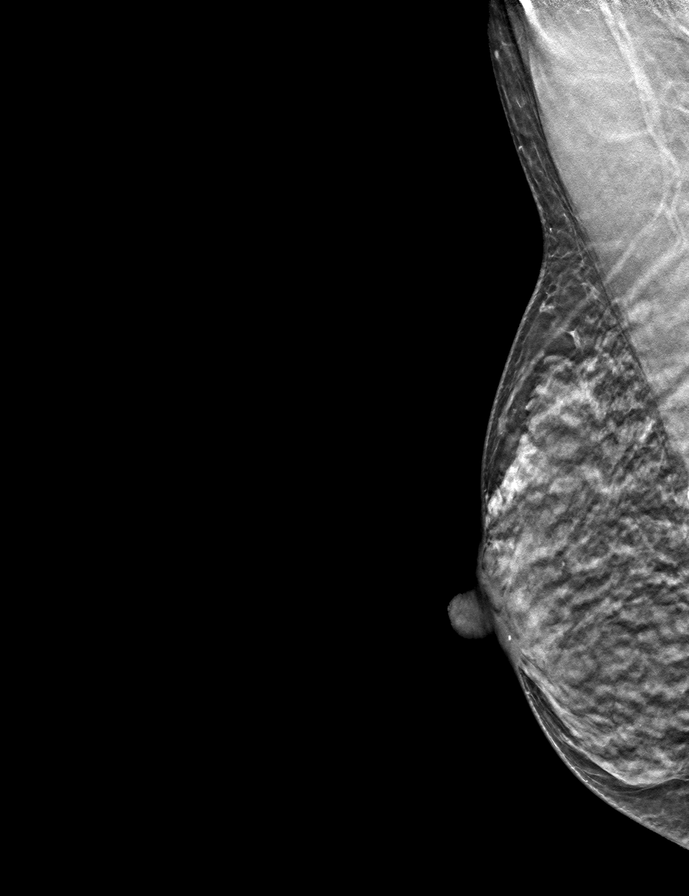
[frame 19/38]
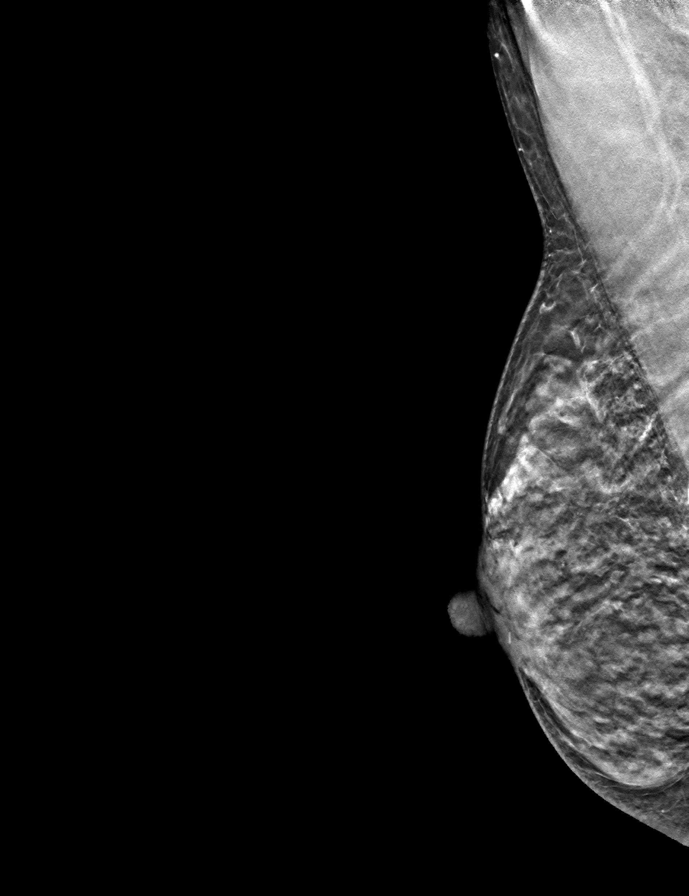

[L CC tomo · tomo slice 19/38.0]
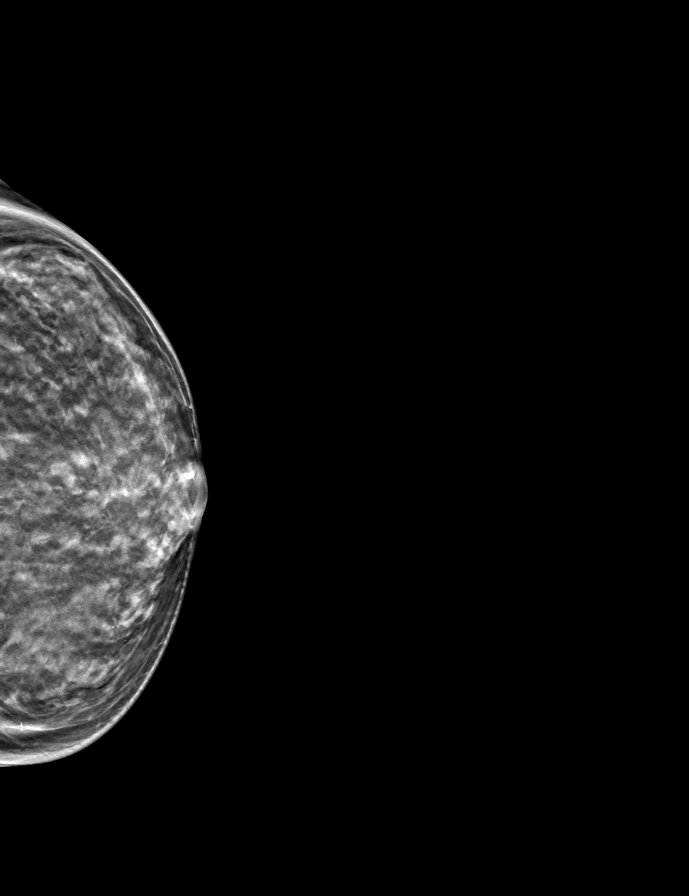

[L MLO tomo · tomo slice 21/40.0]
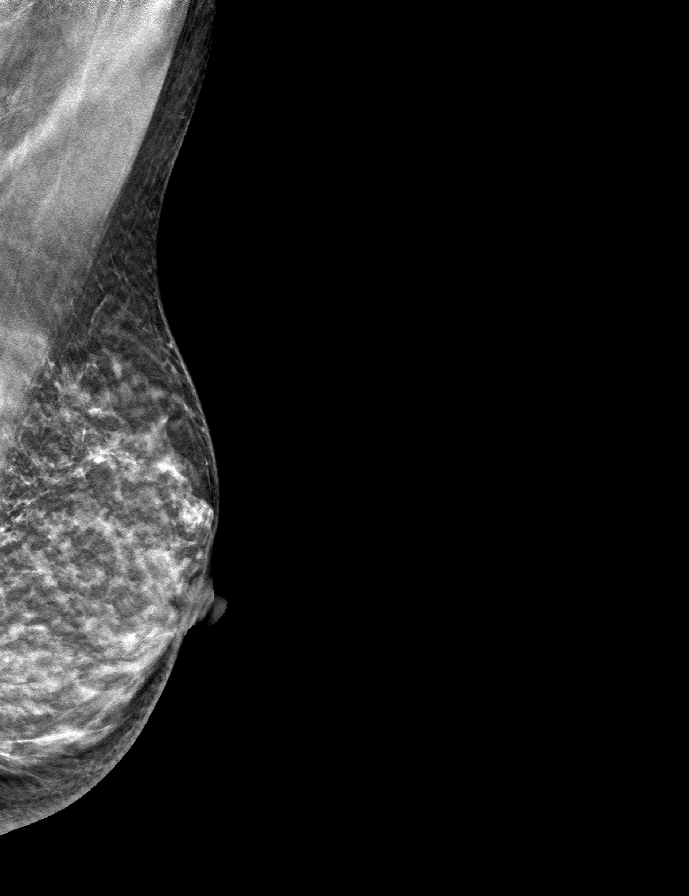

[R CC tomo · tomo slice 20/39.0]
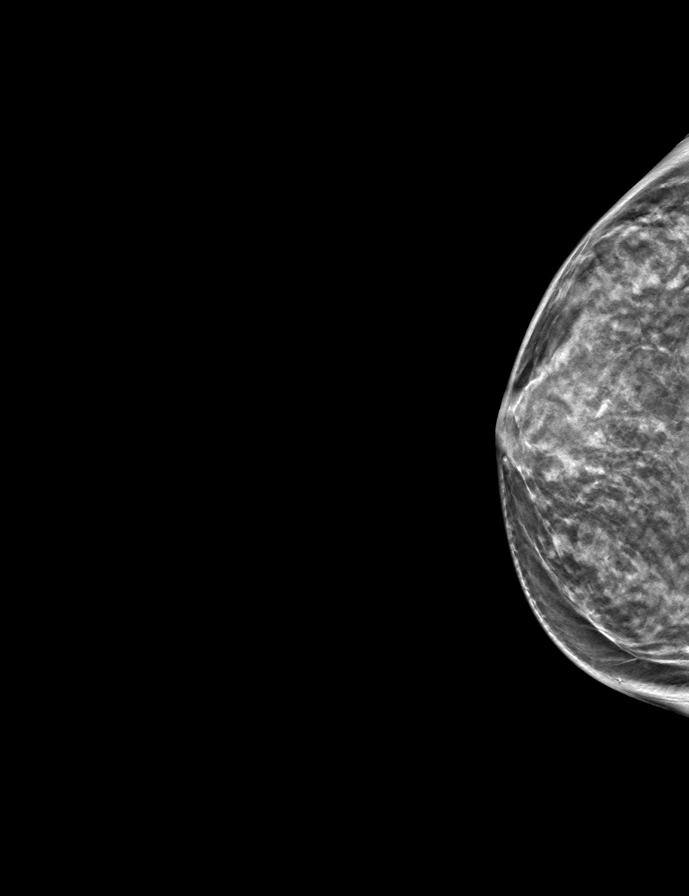

[9 of 24 positions shown; findings below may reference images not displayed]

ACR Breast Density Category d: The breast tissue is extremely dense,
which lowers the sensitivity of mammography
FINDINGS: There are no findings suspicious for malignancy. Images were
processed with CAD.
IMPRESSION: No mammographic evidence of malignancy. A result letter of this
screening mammogram will be mailed directly to the patient.

RECOMMENDATION:
Screening mammogram in one year. (Code:WO-0-ZI0)

BI-RADS CATEGORY  1: Negative.

## 2020-03-26 DIAGNOSIS — G479 Sleep disorder, unspecified: Secondary | ICD-10-CM | POA: Diagnosis not present

## 2020-03-26 DIAGNOSIS — R7303 Prediabetes: Secondary | ICD-10-CM | POA: Diagnosis not present

## 2020-03-26 DIAGNOSIS — F419 Anxiety disorder, unspecified: Secondary | ICD-10-CM | POA: Diagnosis not present

## 2020-03-26 DIAGNOSIS — I10 Essential (primary) hypertension: Secondary | ICD-10-CM | POA: Diagnosis not present

## 2020-04-16 DIAGNOSIS — M7062 Trochanteric bursitis, left hip: Secondary | ICD-10-CM | POA: Diagnosis not present

## 2020-05-13 DIAGNOSIS — M7501 Adhesive capsulitis of right shoulder: Secondary | ICD-10-CM | POA: Diagnosis not present

## 2020-05-22 ENCOUNTER — Other Ambulatory Visit: Payer: Self-pay | Admitting: Family Medicine

## 2020-05-22 DIAGNOSIS — Z1231 Encounter for screening mammogram for malignant neoplasm of breast: Secondary | ICD-10-CM

## 2020-06-17 DIAGNOSIS — Z113 Encounter for screening for infections with a predominantly sexual mode of transmission: Secondary | ICD-10-CM | POA: Diagnosis not present

## 2020-06-17 DIAGNOSIS — Z124 Encounter for screening for malignant neoplasm of cervix: Secondary | ICD-10-CM | POA: Diagnosis not present

## 2020-06-17 DIAGNOSIS — Z6821 Body mass index (BMI) 21.0-21.9, adult: Secondary | ICD-10-CM | POA: Diagnosis not present

## 2020-06-17 DIAGNOSIS — Z01419 Encounter for gynecological examination (general) (routine) without abnormal findings: Secondary | ICD-10-CM | POA: Diagnosis not present

## 2020-07-20 ENCOUNTER — Ambulatory Visit
Admission: RE | Admit: 2020-07-20 | Discharge: 2020-07-20 | Disposition: A | Discharge status: Home / Self Care | Payer: BC Managed Care – PPO | Source: Ambulatory Visit | Attending: Family Medicine | Admitting: Family Medicine

## 2020-07-20 ENCOUNTER — Other Ambulatory Visit: Payer: Self-pay

## 2020-07-20 DIAGNOSIS — Z1231 Encounter for screening mammogram for malignant neoplasm of breast: Secondary | ICD-10-CM

## 2020-08-06 DIAGNOSIS — F411 Generalized anxiety disorder: Secondary | ICD-10-CM | POA: Diagnosis not present

## 2020-09-16 DIAGNOSIS — I10 Essential (primary) hypertension: Secondary | ICD-10-CM | POA: Diagnosis not present

## 2020-09-16 DIAGNOSIS — R7301 Impaired fasting glucose: Secondary | ICD-10-CM | POA: Diagnosis not present

## 2020-09-16 DIAGNOSIS — E7800 Pure hypercholesterolemia, unspecified: Secondary | ICD-10-CM | POA: Diagnosis not present

## 2020-09-16 DIAGNOSIS — G4700 Insomnia, unspecified: Secondary | ICD-10-CM | POA: Diagnosis not present

## 2020-09-16 DIAGNOSIS — Z0000 Encounter for general adult medical examination without abnormal findings: Secondary | ICD-10-CM | POA: Diagnosis not present

## 2021-04-05 DIAGNOSIS — E7800 Pure hypercholesterolemia, unspecified: Secondary | ICD-10-CM | POA: Diagnosis not present

## 2021-04-05 DIAGNOSIS — R7303 Prediabetes: Secondary | ICD-10-CM | POA: Diagnosis not present

## 2021-04-05 DIAGNOSIS — F419 Anxiety disorder, unspecified: Secondary | ICD-10-CM | POA: Diagnosis not present

## 2021-04-05 DIAGNOSIS — I10 Essential (primary) hypertension: Secondary | ICD-10-CM | POA: Diagnosis not present

## 2021-06-17 DIAGNOSIS — R42 Dizziness and giddiness: Secondary | ICD-10-CM | POA: Diagnosis not present

## 2021-08-03 DIAGNOSIS — F419 Anxiety disorder, unspecified: Secondary | ICD-10-CM | POA: Diagnosis not present

## 2021-08-03 DIAGNOSIS — I10 Essential (primary) hypertension: Secondary | ICD-10-CM | POA: Diagnosis not present

## 2021-08-03 DIAGNOSIS — Z0000 Encounter for general adult medical examination without abnormal findings: Secondary | ICD-10-CM | POA: Diagnosis not present

## 2021-08-03 DIAGNOSIS — Z1159 Encounter for screening for other viral diseases: Secondary | ICD-10-CM | POA: Diagnosis not present

## 2021-08-03 DIAGNOSIS — R7303 Prediabetes: Secondary | ICD-10-CM | POA: Diagnosis not present

## 2021-08-03 DIAGNOSIS — E7800 Pure hypercholesterolemia, unspecified: Secondary | ICD-10-CM | POA: Diagnosis not present

## 2021-08-30 ENCOUNTER — Other Ambulatory Visit: Payer: Self-pay | Admitting: Family Medicine

## 2021-08-30 DIAGNOSIS — Z1231 Encounter for screening mammogram for malignant neoplasm of breast: Secondary | ICD-10-CM

## 2021-09-09 ENCOUNTER — Ambulatory Visit
Admission: RE | Admit: 2021-09-09 | Discharge: 2021-09-09 | Disposition: A | Discharge status: Home / Self Care | Payer: BC Managed Care – PPO | Source: Ambulatory Visit | Attending: Family Medicine | Admitting: Family Medicine

## 2021-09-09 DIAGNOSIS — Z1231 Encounter for screening mammogram for malignant neoplasm of breast: Secondary | ICD-10-CM

## 2021-09-13 ENCOUNTER — Other Ambulatory Visit: Payer: Self-pay | Admitting: Family Medicine

## 2021-09-13 DIAGNOSIS — R928 Other abnormal and inconclusive findings on diagnostic imaging of breast: Secondary | ICD-10-CM

## 2021-09-21 ENCOUNTER — Ambulatory Visit
Admission: RE | Admit: 2021-09-21 | Discharge: 2021-09-21 | Disposition: A | Discharge status: Home / Self Care | Payer: BC Managed Care – PPO | Source: Ambulatory Visit | Attending: Family Medicine | Admitting: Family Medicine

## 2021-09-21 ENCOUNTER — Other Ambulatory Visit: Payer: Self-pay | Admitting: Family Medicine

## 2021-09-21 DIAGNOSIS — R928 Other abnormal and inconclusive findings on diagnostic imaging of breast: Secondary | ICD-10-CM

## 2021-09-21 DIAGNOSIS — R922 Inconclusive mammogram: Secondary | ICD-10-CM | POA: Diagnosis not present

## 2021-09-21 DIAGNOSIS — N6323 Unspecified lump in the left breast, lower outer quadrant: Secondary | ICD-10-CM | POA: Diagnosis not present

## 2021-09-21 DIAGNOSIS — N6324 Unspecified lump in the left breast, lower inner quadrant: Secondary | ICD-10-CM | POA: Diagnosis not present

## 2021-09-21 DIAGNOSIS — N6320 Unspecified lump in the left breast, unspecified quadrant: Secondary | ICD-10-CM

## 2021-09-22 ENCOUNTER — Ambulatory Visit
Admission: RE | Admit: 2021-09-22 | Discharge: 2021-09-22 | Disposition: A | Discharge status: Home / Self Care | Payer: BC Managed Care – PPO | Source: Ambulatory Visit | Attending: Family Medicine | Admitting: Family Medicine

## 2021-09-22 DIAGNOSIS — N6012 Diffuse cystic mastopathy of left breast: Secondary | ICD-10-CM | POA: Diagnosis not present

## 2021-09-22 DIAGNOSIS — N6320 Unspecified lump in the left breast, unspecified quadrant: Secondary | ICD-10-CM

## 2021-09-22 DIAGNOSIS — N6323 Unspecified lump in the left breast, lower outer quadrant: Secondary | ICD-10-CM | POA: Diagnosis not present

## 2022-01-01 DIAGNOSIS — J101 Influenza due to other identified influenza virus with other respiratory manifestations: Secondary | ICD-10-CM | POA: Diagnosis not present

## 2022-01-01 DIAGNOSIS — Z03818 Encounter for observation for suspected exposure to other biological agents ruled out: Secondary | ICD-10-CM | POA: Diagnosis not present

## 2022-01-01 DIAGNOSIS — J029 Acute pharyngitis, unspecified: Secondary | ICD-10-CM | POA: Diagnosis not present

## 2022-01-01 DIAGNOSIS — R52 Pain, unspecified: Secondary | ICD-10-CM | POA: Diagnosis not present

## 2022-01-01 DIAGNOSIS — R509 Fever, unspecified: Secondary | ICD-10-CM | POA: Diagnosis not present

## 2022-02-03 DIAGNOSIS — R7303 Prediabetes: Secondary | ICD-10-CM | POA: Diagnosis not present

## 2022-02-03 DIAGNOSIS — I10 Essential (primary) hypertension: Secondary | ICD-10-CM | POA: Diagnosis not present

## 2022-02-03 DIAGNOSIS — G4700 Insomnia, unspecified: Secondary | ICD-10-CM | POA: Diagnosis not present

## 2022-02-03 DIAGNOSIS — F411 Generalized anxiety disorder: Secondary | ICD-10-CM | POA: Diagnosis not present

## 2022-02-28 ENCOUNTER — Ambulatory Visit
Admission: RE | Admit: 2022-02-28 | Discharge: 2022-02-28 | Disposition: A | Discharge status: Home / Self Care | Payer: BC Managed Care – PPO | Source: Ambulatory Visit | Attending: Family Medicine | Admitting: Family Medicine

## 2022-02-28 ENCOUNTER — Other Ambulatory Visit: Payer: Self-pay | Admitting: Family Medicine

## 2022-02-28 DIAGNOSIS — R053 Chronic cough: Secondary | ICD-10-CM

## 2022-02-28 DIAGNOSIS — J208 Acute bronchitis due to other specified organisms: Secondary | ICD-10-CM | POA: Diagnosis not present

## 2022-03-14 DIAGNOSIS — R8290 Unspecified abnormal findings in urine: Secondary | ICD-10-CM | POA: Diagnosis not present

## 2022-03-14 DIAGNOSIS — R300 Dysuria: Secondary | ICD-10-CM | POA: Diagnosis not present

## 2022-03-14 DIAGNOSIS — N898 Other specified noninflammatory disorders of vagina: Secondary | ICD-10-CM | POA: Diagnosis not present

## 2022-03-23 DIAGNOSIS — R053 Chronic cough: Secondary | ICD-10-CM | POA: Diagnosis not present

## 2022-03-23 DIAGNOSIS — M545 Low back pain, unspecified: Secondary | ICD-10-CM | POA: Diagnosis not present

## 2022-04-18 DIAGNOSIS — R0789 Other chest pain: Secondary | ICD-10-CM | POA: Diagnosis not present

## 2022-04-18 DIAGNOSIS — R053 Chronic cough: Secondary | ICD-10-CM | POA: Diagnosis not present

## 2022-04-27 DIAGNOSIS — R0789 Other chest pain: Secondary | ICD-10-CM | POA: Diagnosis not present

## 2022-04-27 DIAGNOSIS — R059 Cough, unspecified: Secondary | ICD-10-CM | POA: Diagnosis not present

## 2022-05-13 ENCOUNTER — Ambulatory Visit (INDEPENDENT_AMBULATORY_CARE_PROVIDER_SITE_OTHER): Payer: BC Managed Care – PPO | Admitting: Pulmonary Disease

## 2022-05-13 ENCOUNTER — Encounter: Payer: Self-pay | Admitting: Pulmonary Disease

## 2022-05-13 VITALS — BP 118/64 | Ht 61.0 in | Wt 113.0 lb

## 2022-05-13 DIAGNOSIS — R053 Chronic cough: Secondary | ICD-10-CM | POA: Diagnosis not present

## 2022-05-13 NOTE — Progress Notes (Signed)
  ID: Sharon Boyer, female    DOB: [birth year], 57 y.o.   MRN: [id number]  Chief Complaint  Patient presents with   Consult    Pt is here for consult for cough. Pt states that the cough is better now. Pt states that cough was present for about 3-4 months and before it occurred she had the flu.     Referring provider: Tally Joe, MD  HPI:   57 y.o. woman whom we are seeing in evaluation of chronic cough.  Most recent PCP note reviewed.  Most recent pulmonary note 2018 Dr. Marchelle Gearing reviewed.  Previous history of chronic cough.  Does not seem like it lasted more in a couple months.  Some concern for reflux versus cyclical cough.  Advised voice rest etc.  Seem like they have gotten better.  Lasting in pulmonary clinic in 2018 and was an issue for many years.  She got flu December 2023.  Got better from acute setting send a few weeks later cough returns.  Largely dry.  No time of day when things are better or worse per the position to make things better or worse.  Tried multiple different things medication wise Tessalon Perles etc. cough syrup did not help.  Was placed on PPI and symptoms started to get better.  She is not sure if it was just getting better on its own it was got better with that.  She been off PPI for a few weeks and cough remains okay.  Has not used inhalers.  No prednisone.  Most recent chest x-ray 03/02/2022 reviewed interpret as clear lungs with hyperinflation on both the PA and lateral views.  On my review interpretation of prior most recent chest x-ray 2018 this is unchanged.    Questionaires / Pulmonary Flowsheets:   ACT:      No data to display          MMRC:     No data to display          Epworth:      No data to display          Tests:   FENO:  Lab Results  Component Value Date   NITRICOXIDE 18 06/03/2016    PFT:     No data to display          WALK:      No data to display          Imaging: Personally  reviewed and as per EMR discussion this note No results found.  Lab Results: Personally reviewed CBC No results found for: "WBC", "RBC", "HGB", "HCT", "PLT", "MCV", "MCH", "MCHC", "RDW", "LYMPHSABS", "MONOABS", "EOSABS", "BASOSABS"  BMET No results found for: "NA", "K", "CL", "CO2", "GLUCOSE", "BUN", "CREATININE", "CALCIUM", "GFRNONAA", "GFRAA"  BNP No results found for: "BNP"  ProBNP No results found for: "PROBNP"  Specialty Problems       Pulmonary Problems   Chronic cough    No Known Allergies  Immunization History  Administered Date(s) Administered   Influenza, High Dose Seasonal PF 10/25/2015    History reviewed. No pertinent past medical history.  Tobacco History: Social History   Tobacco Use  Smoking Status Never  Smokeless Tobacco Never   Counseling given: Not Answered   Continue to not smoke  Outpatient Encounter Medications as of 05/13/2022  Medication Sig   ALPRAZolam (XANAX) 0.25 MG tablet Take 0.25 mg by mouth 2 (two) times daily as needed.   amLODipine (NORVASC) 10 MG tablet Take 10  mg by mouth daily.   carvedilol (COREG) 12.5 MG tablet Take 12.5 mg by mouth 2 (two) times daily.   clotrimazole (LOTRIMIN) 1 % external solution Apply 1 application topically 2 (two) times daily. In between toes   olmesartan (BENICAR) 40 MG tablet Take 40 mg by mouth daily.   predniSONE (DELTASONE) 20 MG tablet Take by mouth.   terbinafine (LAMISIL) 250 MG tablet Take 1 tablet (250 mg total) by mouth daily.   Terbinafine HCl (LAMISIL AT SPRAY) 1 % SOLN Spray daily to the tops and bottoms of both feet at bedtime   zolpidem (AMBIEN) 10 MG tablet Take 10 mg by mouth at bedtime.   No facility-administered encounter medications on file as of 05/13/2022.     Review of Systems  Review of Systems  No chest pain exertion.  No orthopnea or PND.  Comprehensive review of systems otherwise negative. Physical Exam  BP 118/64 (BP Location: Left Arm, Patient Position:  Sitting, Cuff Size: Normal)   Ht  (1.549 m)   Wt 113 lb (51.3 kg)   LMP 01/06/2011   BMI 21.35 kg/m   Wt Readings from Last 5 Encounters:  05/13/22 113 lb (51.3 kg)  06/03/16 115 lb (52.2 kg)    BMI Readings from Last 5 Encounters:  05/13/22 21.35 kg/m  06/03/16 21.73 kg/m     Physical Exam General: Sitting in chair, no acute distress Eyes: EOMI, no icterus Neck: Supple, no JVP Pulmonary: Clear, normal work of breathing Cardiovascular: Warm, no edema Abdomen: Nondistended, Bowel sounds present MSK: No synovitis, no joint effusion Neuro: Normal gait, no weakness Psych: Normal mood, full affect   Assessment & Plan:   Chronic cough: Present for 3 to 4 months.  Following flu infection.  Did get better timelines with initiation of PPI.  She has hyperinflated lungs as well.  High suspicion for reactive airways disease, likely worsened or triggered by viral illness.  Improving overall.  Nearly gone.  Given improvement in symptoms, no further workup or treatment today.  Consider trial of inhalers, ICS/LABA in the future versus resumption of PPI.  Hyperinflation on chest x-ray: Never smoker.  Suspect related to subclinical chronic asthma with flares of asthma symptoms with prolonged cough, prolonged bronchitis symptoms.  Return if symptoms worsen or fail to improve.   Karren Burly, MD 05/13/2022   This appointment required 58 minutes of patient care (this includes precharting, chart review, review of results, face-to-face care, etc.).

## 2022-05-13 NOTE — Patient Instructions (Signed)
Nice to meet you  I am glad the cough is better  Sometimes it is hard to pinpoint a cause of cough, it seems like things got better with the acid medicine but I am not totally convinced that reflux was the issue.  Your chest x-ray is clear but is a little bit hyperinflated but sometimes can be a subtle sign of asthma.  Oftentimes asthma can flare intermittently and have long periods of time where it is not a problem.  It is common for viral illness like the flu the trigger asthma-like symptoms and cough.  If the cough were to come back in the future we could consider retrying the acid medicine versus trying an inhaler to see if that helps.  Return to clinic as needed

## 2022-05-24 ENCOUNTER — Ambulatory Visit: Payer: BC Managed Care – PPO | Admitting: Pulmonary Disease

## 2022-05-24 ENCOUNTER — Telehealth: Payer: Self-pay | Admitting: Pulmonary Disease

## 2022-05-24 MED ORDER — FLUTICASONE-SALMETEROL 250-50 MCG/ACT IN AEPB: 1.0000 | INHALATION_SPRAY | Freq: Two times a day (BID) | RESPIRATORY_TRACT | 3 refills | Status: DC

## 2022-05-24 NOTE — Telephone Encounter (Signed)
Patient seen in clinic 05/13/22.  For chronic cough that was were improving.  After flu infection.  Has worsened in the interim.  Watery eyes runny nose.  Allergy-like symptoms.  Hyperinflation on prior chest x-ray.  High suspicion for asthma as discussed at last visit.  Given just seen recently, called patient and asked if she would be okay trying initial therapy ICS/LABA therapy as discussed in my clinic note 4/19.  Without a visit today.  She stated yes.  She denies any fever.  Denies any significant dyspnea.  Just some near vomiting and shortness of breath while she coughs.  Otherwise doing okay.  Will send in Advair discus 250 dose 1 puff twice a day.  Rinse out mouth with water after every use.  Instructed to call or send a message if is too expensive and I can look for more cost effective solution.  From the staff, please cancel appointment for afternoon 05/24/2022 and reschedule for sometime in the next 2 to 4 weeks with myself.

## 2022-05-24 NOTE — Telephone Encounter (Signed)
CMA has rescheduled appointment to 5/16 @245PM  with Dr. Judeth Horn. Nothing further needed.

## 2022-06-09 ENCOUNTER — Encounter: Payer: Self-pay | Admitting: Pulmonary Disease

## 2022-06-09 ENCOUNTER — Ambulatory Visit (INDEPENDENT_AMBULATORY_CARE_PROVIDER_SITE_OTHER): Payer: BC Managed Care – PPO | Admitting: Pulmonary Disease

## 2022-06-09 VITALS — BP 132/70 | HR 91 | Ht 61.0 in | Wt 111.0 lb

## 2022-06-09 DIAGNOSIS — R053 Chronic cough: Secondary | ICD-10-CM | POA: Diagnosis not present

## 2022-06-09 MED ORDER — PREDNISONE 20 MG PO TABS: ORAL_TABLET | ORAL | 0 refills | Status: AC

## 2022-06-09 MED ORDER — BREZTRI AEROSPHERE 160-9-4.8 MCG/ACT IN AERO: 2.0000 | INHALATION_SPRAY | Freq: Two times a day (BID) | RESPIRATORY_TRACT | 0 refills | Status: DC

## 2022-06-09 MED ORDER — BREZTRI AEROSPHERE 160-9-4.8 MCG/ACT IN AERO: 2.0000 | INHALATION_SPRAY | Freq: Two times a day (BID) | RESPIRATORY_TRACT | 11 refills | Status: DC

## 2022-06-09 NOTE — Progress Notes (Signed)
@Patient  ID: Sharon Boyer, female    DOB: 05/10/1965, 57 y.o.   MRN: [id number]  Chief Complaint  Patient presents with   Follow-up    1 mo f/u for cough. States the cough has not changed since last visit. Still unable to produce any phlegm.     Referring provider: Merri Brunette, MD  HPI:   57 y.o. woman whom we are seeing in evaluation of chronic cough.  Returns for routine follow-up.  Initially cough with been improving.  No further workup recommended.  She called a couple weeks later and cough and returned.  Prescribed mid dose Advair discus.  Has not helped at all.  Mostly dry cough.  Occasionally clear phlegm.  Again with hyperinflated chest x-ray asthma highly suspicious.  Discussed role and rationale for prednisone therapy given prolonged symptoms despite initial inhaler therapy.  Discussed role and rationale for escalating inhaler therapy.  HPI at initial visit: Previous history of chronic cough.  Does not seem like it lasted more in a couple months.  Some concern for reflux versus cyclical cough.  Advised voice rest etc.  Seem like they have gotten better.  Lasting in pulmonary clinic in 2018 and was an issue for many years.  She got flu December 2023.  Got better from acute setting send a few weeks later cough returns.  Largely dry.  No time of day when things are better or worse per the position to make things better or worse.  Tried multiple different things medication wise Tessalon Perles etc. cough syrup did not help.  Was placed on PPI and symptoms started to get better.  She is not sure if it was just getting better on its own it was got better with that.  She been off PPI for a few weeks and cough remains okay.  Has not used inhalers.  No prednisone.  Most recent chest x-ray 03/02/2022 reviewed interpret as clear lungs with hyperinflation on both the PA and lateral views.  On my review interpretation of prior most recent chest x-ray 2018 this is  unchanged.    Questionaires / Pulmonary Flowsheets:   ACT:      No data to display           MMRC:     No data to display           Epworth:      No data to display           Tests:   FENO:  Lab Results  Component Value Date   NITRICOXIDE 18 06/03/2016    PFT:     No data to display           WALK:      No data to display           Imaging: Personally reviewed and as per EMR discussion this note No results found.  Lab Results: Personally reviewed CBC No results found for: "WBC", "RBC", "HGB", "HCT", "PLT", "MCV", "MCH", "MCHC", "RDW", "LYMPHSABS", "MONOABS", "EOSABS", "BASOSABS"  BMET No results found for: "NA", "K", "CL", "CO2", "GLUCOSE", "BUN", "CREATININE", "CALCIUM", "GFRNONAA", "GFRAA"  BNP No results found for: "BNP"  ProBNP No results found for: "PROBNP"  Specialty Problems       Pulmonary Problems   Chronic cough    No Known Allergies  Immunization History  Administered Date(s) Administered   Influenza, High Dose Seasonal PF 10/25/2015    History reviewed. No pertinent past medical history.  Tobacco History: Social History  Tobacco Use  Smoking Status Never  Smokeless Tobacco Never   Counseling given: Not Answered   Continue to not smoke  Outpatient Encounter Medications as of 06/09/2022  Medication Sig   ALPRAZolam (XANAX) 0.25 MG tablet Take 0.25 mg by mouth 2 (two) times daily as needed.   amLODipine (NORVASC) 10 MG tablet Take 10 mg by mouth daily.   carvedilol (COREG) 12.5 MG tablet Take 12.5 mg by mouth 2 (two) times daily.   clotrimazole (LOTRIMIN) 1 % external solution Apply 1 application topically 2 (two) times daily. In between toes   fluticasone-salmeterol (ADVAIR) 250-50 MCG/ACT AEPB Inhale 1 puff into the lungs in the morning and at bedtime.   olmesartan (BENICAR) 40 MG tablet Take 40 mg by mouth daily.   predniSONE (DELTASONE) 20 MG tablet Take by mouth.   predniSONE (DELTASONE) 20  MG tablet Take 2 tablets (40 mg total) by mouth daily with breakfast for 5 days, THEN 1 tablet (20 mg total) daily with breakfast for 5 days.   terbinafine (LAMISIL) 250 MG tablet Take 1 tablet (250 mg total) by mouth daily.   Terbinafine HCl (LAMISIL AT SPRAY) 1 % SOLN Spray daily to the tops and bottoms of both feet at bedtime   zolpidem (AMBIEN) 10 MG tablet Take 10 mg by mouth at bedtime.   No facility-administered encounter medications on file as of 06/09/2022.     Review of Systems  Review of Systems  N/a Physical Exam  BP 132/70   Pulse 91   Ht 5\' 1"  (1.549 m)   Wt 111 lb (50.3 kg)   LMP 01/06/2011   SpO2 100% Comment: on RA  BMI 20.97 kg/m   Wt Readings from Last 5 Encounters:  06/09/22 111 lb (50.3 kg)  05/13/22 113 lb (51.3 kg)  06/03/16 115 lb (52.2 kg)    BMI Readings from Last 5 Encounters:  06/09/22 20.97 kg/m  05/13/22 21.35 kg/m  06/03/16 21.73 kg/m     Physical Exam General: Sitting in chair, no acute distress Eyes: EOMI, no icterus Neck: Supple, no JVP Pulmonary: Clear, normal work of breathing Cardiovascular: Warm, no edema Abdomen: Nondistended, Bowel sounds present MSK: No synovitis, no joint effusion Neuro: Normal gait, no weakness Psych: Normal mood, full affect   Assessment & Plan:   Chronic cough: Present for months.  Following flu infection.  Did get better timelines with initiation of PPI.  She has hyperinflated lungs as well.  High suspicion for reactive airways disease, likely worsened or triggered by viral illness.  Initially improved spontaneously.  Now returned.  No better despite trial of mid dose Advair discus.  Given cough is primary complaint would prefer to avoid further DPI therapy.  Escalate to three 2 puff twice daily.  Samples today.  Will prescribe, if too expensive can look for alternatives in the future.  Prednisone taper today as well given prolonged symptoms.  Hyperinflation on chest x-ray: Never smoker.  Suspect  related to subclinical chronic asthma with flares of asthma symptoms with prolonged cough, prolonged bronchitis symptoms.  Return in about 2 months (around 08/09/2022).   Karren Burly, MD 06/09/2022

## 2022-06-09 NOTE — Patient Instructions (Signed)
I am sorry the Advair did not help  Take prednisone as prescribed 40 mg for 5 days and 20 mg for 5 days then stop  Stop Advair, use Breztri 2 puffs twice a day every day.  Rinse mouth out with water after every use.  Please let me know if things not improving in the coming weeks  Return to clinic in 2 months or sooner as needed with Dr. Judeth Horn

## 2022-06-09 NOTE — Addendum Note (Signed)
Addended by: Maurene Capes on: 06/09/2022 03:45 PM   Modules accepted: Orders

## 2022-08-01 DIAGNOSIS — I10 Essential (primary) hypertension: Secondary | ICD-10-CM | POA: Diagnosis not present

## 2022-08-01 DIAGNOSIS — R053 Chronic cough: Secondary | ICD-10-CM | POA: Diagnosis not present

## 2022-08-08 ENCOUNTER — Other Ambulatory Visit: Payer: Self-pay | Admitting: Family Medicine

## 2022-08-08 DIAGNOSIS — R7303 Prediabetes: Secondary | ICD-10-CM | POA: Diagnosis not present

## 2022-08-08 DIAGNOSIS — E7800 Pure hypercholesterolemia, unspecified: Secondary | ICD-10-CM | POA: Diagnosis not present

## 2022-08-08 DIAGNOSIS — Z0000 Encounter for general adult medical examination without abnormal findings: Secondary | ICD-10-CM | POA: Diagnosis not present

## 2022-08-08 DIAGNOSIS — I10 Essential (primary) hypertension: Secondary | ICD-10-CM | POA: Diagnosis not present

## 2022-08-08 DIAGNOSIS — R053 Chronic cough: Secondary | ICD-10-CM | POA: Diagnosis not present

## 2022-08-08 DIAGNOSIS — Z1231 Encounter for screening mammogram for malignant neoplasm of breast: Secondary | ICD-10-CM

## 2022-08-10 ENCOUNTER — Encounter: Payer: Self-pay | Admitting: Pulmonary Disease

## 2022-08-10 ENCOUNTER — Ambulatory Visit: Payer: BC Managed Care – PPO | Admitting: Pulmonary Disease

## 2022-08-10 VITALS — BP 118/62 | HR 86 | Ht 61.0 in | Wt 124.0 lb

## 2022-08-10 DIAGNOSIS — R053 Chronic cough: Secondary | ICD-10-CM | POA: Diagnosis not present

## 2022-08-10 LAB — CBC WITH DIFFERENTIAL/PLATELET
Basophils Absolute: 0 10*3/uL (ref 0.0–0.1)
Basophils Relative: 0.8 % (ref 0.0–3.0)
Eosinophils Absolute: 0.1 10*3/uL (ref 0.0–0.7)
Eosinophils Relative: 1.2 % (ref 0.0–5.0)
HCT: 38.1 % (ref 36.0–46.0)
Hemoglobin: 12.2 g/dL (ref 12.0–15.0)
Lymphocytes Relative: 27.8 % (ref 12.0–46.0)
Lymphs Abs: 1.5 10*3/uL (ref 0.7–4.0)
MCHC: 32 g/dL (ref 30.0–36.0)
MCV: 81.4 fl (ref 78.0–100.0)
Monocytes Absolute: 0.5 10*3/uL (ref 0.1–1.0)
Monocytes Relative: 9.5 % (ref 3.0–12.0)
Neutro Abs: 3.3 10*3/uL (ref 1.4–7.7)
Neutrophils Relative %: 60.7 % (ref 43.0–77.0)
Platelets: 304 10*3/uL (ref 150.0–400.0)
RBC: 4.68 Mil/uL (ref 3.87–5.11)
RDW: 13.2 % (ref 11.5–15.5)
WBC: 5.4 10*3/uL (ref 4.0–10.5)

## 2022-08-10 MED ORDER — PREDNISONE 20 MG PO TABS: ORAL_TABLET | ORAL | 0 refills | Status: AC

## 2022-08-10 NOTE — Patient Instructions (Addendum)
Nice to see you again  Slight cough is come back  We will do a prednisone course today  Same as last time  Blood work today to evaluate for injections or biologic medicines to treat cough related to asthma  I will be in touch regarding results and  the next steps in the coming days  Hopefully we can get the new medication started for you in the next few weeks  Return to clinic in 3 months or sooner if needed with Dr. Judeth Horn

## 2022-08-10 NOTE — Progress Notes (Signed)
@Patient  ID: Sharon Boyer, female    DOB: 11-12-1965, 57 y.o.   MRN: [id number]  Chief Complaint  Patient presents with   Follow-up    2 mo f/u for cough. States the prednisone did help for a while but the cough has returned. Productive cough with clear phlegm.     Referring provider: Merri Brunette, MD  HPI:   57 y.o. woman whom we are seeing in evaluation of chronic cough.  Last seen about 2 months ago here for scheduled follow-up.  Had return of cough that had previously resolved really on its own.  Triggered by what sounds like viral infection, flu 12/2021.  Inhaler regimen was escalated from ICS With the [facility].  She is given a prednisone taper.  Per report, cough initially improved but subsequently has returned.  More productive.  Producing phlegm.  Good adherence to [employer].  HPI at initial visit: Previous history of chronic cough.  Does not seem like it lasted more in a couple months.  Some concern for reflux versus cyclical cough.  Advised voice rest etc.  Seem like they have gotten better.  Lasting in pulmonary clinic in 2018 and was an issue for many years.  She got flu December 2023.  Got better from acute setting send a few weeks later cough returns.  Largely dry.  No time of day when things are better or worse per the position to make things better or worse.  Tried multiple different things medication wise Tessalon Perles etc. cough syrup did not help.  Was placed on PPI and symptoms started to get better.  She is not sure if it was just getting better on its own it was got better with that.  She been off PPI for a few weeks and cough remains okay.  Has not used inhalers.  No prednisone.  Most recent chest x-ray 03/02/2022 reviewed interpret as clear lungs with hyperinflation on both the PA and lateral views.  On my review interpretation of prior most recent chest x-ray 2018 this is unchanged.    Questionaires / Pulmonary Flowsheets:   ACT:      No data to display           MMRC:     No data to display          Epworth:      No data to display          Tests:   FENO:  Lab Results  Component Value Date   NITRICOXIDE 18 06/03/2016    PFT:     No data to display          WALK:      No data to display          Imaging: Personally reviewed and as per EMR discussion this note No results found.  Lab Results: Personally reviewed CBC No results found for: "WBC", "RBC", "HGB", "HCT", "PLT", "MCV", "MCH", "MCHC", "RDW", "LYMPHSABS", "MONOABS", "EOSABS", "BASOSABS"  BMET No results found for: "NA", "K", "CL", "CO2", "GLUCOSE", "BUN", "CREATININE", "CALCIUM", "GFRNONAA", "GFRAA"  BNP No results found for: "BNP"  ProBNP No results found for: "PROBNP"  Specialty Problems       Pulmonary Problems   Chronic cough    No Known Allergies  Immunization History  Administered Date(s) Administered   Influenza, High Dose Seasonal PF 10/25/2015    History reviewed. No pertinent past medical history.  Tobacco History: Social History   Tobacco Use  Smoking Status Never  Smokeless  Tobacco Never   Counseling given: Not Answered   Continue to not smoke  Outpatient Encounter Medications as of 08/10/2022  Medication Sig   ALPRAZolam (XANAX) 0.25 MG tablet Take 0.25 mg by mouth 2 (two) times daily as needed.   amLODipine (NORVASC) 10 MG tablet Take 10 mg by mouth daily.   Budeson-Glycopyrrol-Formoterol (BREZTRI AEROSPHERE) 160-9-4.8 MCG/ACT AERO Inhale 2 puffs into the lungs in the morning and at bedtime.   carvedilol (COREG) 12.5 MG tablet Take 12.5 mg by mouth 2 (two) times daily.   olmesartan (BENICAR) 40 MG tablet Take 40 mg by mouth daily.   predniSONE (DELTASONE) 20 MG tablet Take 2 tablets (40 mg total) by mouth daily with breakfast for 5 days, THEN 1 tablet (20 mg total) daily with breakfast for 5 days.   zolpidem (AMBIEN) 10 MG tablet Take 10 mg by mouth at bedtime.   [DISCONTINUED] clotrimazole  (LOTRIMIN) 1 % external solution Apply 1 application topically 2 (two) times daily. In between toes   [DISCONTINUED] predniSONE (DELTASONE) 20 MG tablet Take by mouth.   [DISCONTINUED] Budeson-Glycopyrrol-Formoterol (BREZTRI AEROSPHERE) 160-9-4.8 MCG/ACT AERO Inhale 2 puffs into the lungs in the morning and at bedtime.   [DISCONTINUED] terbinafine (LAMISIL) 250 MG tablet Take 1 tablet (250 mg total) by mouth daily.   [DISCONTINUED] Terbinafine HCl (LAMISIL AT SPRAY) 1 % SOLN Spray daily to the tops and bottoms of both feet at bedtime   No facility-administered encounter medications on file as of 08/10/2022.     Review of Systems  Review of Systems  N/a Physical Exam  BP 118/62   Pulse 86   Ht 5\' 1"  (1.549 m)   Wt 124 lb (56.2 kg)   LMP 01/06/2011   SpO2 100% Comment: on RA  BMI 23.43 kg/m   Wt Readings from Last 5 Encounters:  08/10/22 124 lb (56.2 kg)  06/09/22 111 lb (50.3 kg)  05/13/22 113 lb (51.3 kg)  06/03/16 115 lb (52.2 kg)    BMI Readings from Last 5 Encounters:  08/10/22 23.43 kg/m  06/09/22 20.97 kg/m  05/13/22 21.35 kg/m  06/03/16 21.73 kg/m     Physical Exam General: Sitting in chair, no acute distress Eyes: EOMI, no icterus Neck: Supple, no JVP Pulmonary: Clear, normal work of breathing Cardiovascular: Warm, no edema Abdomen: Nondistended, Bowel sounds present MSK: No synovitis, no joint effusion Neuro: Normal gait, no weakness Psych: Normal mood, full affect   Assessment & Plan:   Chronic cough/cough variant asthma: Present for months.  Following flu infection.  Did get better with initiation of PPI.  She has hyperinflated lungs as well.  High suspicion for reactive airways disease, likely worsened or triggered by viral illness, flu 12/2021.  Initially improved spontaneously.  Now returned.  No better despite trial of mid dose Advair discus.  Escalate to [facility] and improved after prednisone course.  Now returned despite good adherence to  [facility].  Phenotyping today for consideration of biologic therapy.  CBC, IgE, RAST panel  Hyperinflation on chest x-ray: Never smoker.  Suspect related to subclinical chronic asthma with flares of asthma symptoms with prolonged cough, prolonged bronchitis symptoms.  Return in about 3 months (around 11/10/2022).   Karren Burly, MD 08/10/2022

## 2022-08-11 LAB — IGE: IgE (Immunoglobulin E), Serum: 59 kU/L (ref <=114)

## 2022-08-14 LAB — ALLERGEN PROFILE, PERENNIAL ALLERGEN IGE
Alternaria Alternata IgE: <0.1 kU/L
Aspergillus Fumigatus IgE: <0.1 kU/L
Aureobasidi Pullulans IgE: <0.1 kU/L
Candida Albicans IgE: <0.1 kU/L
Cat Dander IgE: 1.11 kU/L — AB
Chicken Feathers IgE: <0.1 kU/L
Cladosporium Herbarum IgE: <0.1 kU/L
Cow Dander IgE: <0.1 kU/L
D Farinae IgE: <0.1 kU/L
D Pteronyssinus IgE: <0.1 kU/L
Dog Dander IgE: 0.19 kU/L — AB
Duck Feathers IgE: <0.1 kU/L
Goose Feathers IgE: <0.1 kU/L
Mouse Urine IgE: <0.1 kU/L
Mucor Racemosus IgE: <0.1 kU/L
Penicillium Chrysogen IgE: <0.1 kU/L
Phoma Betae IgE: <0.1 kU/L
Setomelanomma Rostrat: <0.1 kU/L
Stemphylium Herbarum IgE: <0.1 kU/L

## 2022-09-14 ENCOUNTER — Ambulatory Visit: Admission: RE | Admit: 2022-09-14 | Payer: BC Managed Care – PPO | Source: Ambulatory Visit

## 2022-09-14 DIAGNOSIS — Z1231 Encounter for screening mammogram for malignant neoplasm of breast: Secondary | ICD-10-CM | POA: Diagnosis not present

## 2022-09-15 ENCOUNTER — Ambulatory Visit: Payer: BC Managed Care – PPO

## 2022-09-16 ENCOUNTER — Other Ambulatory Visit: Payer: Self-pay | Admitting: Family Medicine

## 2022-09-16 DIAGNOSIS — R928 Other abnormal and inconclusive findings on diagnostic imaging of breast: Secondary | ICD-10-CM

## 2022-09-19 DIAGNOSIS — J3089 Other allergic rhinitis: Secondary | ICD-10-CM | POA: Diagnosis not present

## 2022-09-19 DIAGNOSIS — R052 Subacute cough: Secondary | ICD-10-CM | POA: Diagnosis not present

## 2022-09-29 ENCOUNTER — Ambulatory Visit
Admission: RE | Admit: 2022-09-29 | Discharge: 2022-09-29 | Disposition: A | Discharge status: Home / Self Care | Payer: BC Managed Care – PPO | Source: Ambulatory Visit | Attending: Family Medicine | Admitting: Family Medicine

## 2022-09-29 ENCOUNTER — Ambulatory Visit: Payer: BC Managed Care – PPO

## 2022-09-29 DIAGNOSIS — R928 Other abnormal and inconclusive findings on diagnostic imaging of breast: Secondary | ICD-10-CM | POA: Diagnosis not present

## 2022-10-28 DIAGNOSIS — H9202 Otalgia, left ear: Secondary | ICD-10-CM | POA: Diagnosis not present

## 2022-10-28 DIAGNOSIS — J309 Allergic rhinitis, unspecified: Secondary | ICD-10-CM | POA: Diagnosis not present

## 2022-10-28 DIAGNOSIS — H6990 Unspecified Eustachian tube disorder, unspecified ear: Secondary | ICD-10-CM | POA: Diagnosis not present

## 2022-11-28 ENCOUNTER — Ambulatory Visit (INDEPENDENT_AMBULATORY_CARE_PROVIDER_SITE_OTHER): Payer: BC Managed Care – PPO | Admitting: Pulmonary Disease

## 2022-11-28 ENCOUNTER — Encounter: Payer: Self-pay | Admitting: Pulmonary Disease

## 2022-11-28 VITALS — BP 134/75 | HR 100 | Ht 61.0 in | Wt 115.0 lb

## 2022-11-28 DIAGNOSIS — R053 Chronic cough: Secondary | ICD-10-CM | POA: Diagnosis not present

## 2022-11-28 MED ORDER — PREDNISONE 10 MG PO TABS: ORAL_TABLET | ORAL | 0 refills | Status: AC

## 2022-11-28 NOTE — Patient Instructions (Signed)
Nice to see you again  Continue all inhalers  Take prednisone 40 mg for 7 days then 20 mg for 7 days then 10 mg for 7 days then stop  Sending message if cough comes back after prednisone and we need to think about starting a medicine called Tezspire -the injection for asthma and cough related to asthma.  We can start paperwork in the interim before your next visit if needed.  Return to clinic in 3 months or sooner as needed with Dr. Judeth Horn

## 2022-11-28 NOTE — Progress Notes (Signed)
@Patient  ID: Sharon Boyer, female    DOB: 1965-03-05, 57 y.o.   MRN: [id number]  Chief Complaint  Patient presents with   Follow-up    Referring provider: Merri Brunette, MD  HPI:   57 y.o. woman whom we are seeing in evaluation of chronic cough.  Recent allergy and immunology note reviewed.  Most recent PCP note reviewed.  Returns for scheduled follow-up.  Still with ongoing issues with cough.  Good adherence to [employer].  Allergy immunology recommended antihistamines.  Allergy testing seem positive for a lot of outdoor allergies.  Took for 30 days.  Not much better.  Notes that prednisone historically controls the cough quite well.  Then recurs.  Discussed likely culprit asthma.  Discussed prednisone taper and if no long-term improvement or  if symptoms recur, need for biologic medication.  Expressed understanding.  HPI at initial visit: Previous history of chronic cough.  Does not seem like it lasted more in a couple months.  Some concern for reflux versus cyclical cough.  Advised voice rest etc.  Seem like they have gotten better.  Lasting in pulmonary clinic in 2018 and was an issue for many years.  She got flu December 2023.  Got better from acute setting send a few weeks later cough returns.  Largely dry.  No time of day when things are better or worse per the position to make things better or worse.  Tried multiple different things medication wise Tessalon Perles etc. cough syrup did not help.  Was placed on PPI and symptoms started to get better.  She is not sure if it was just getting better on its own it was got better with that.  She been off PPI for a few weeks and cough remains okay.  Has not used inhalers.  No prednisone.  Most recent chest x-ray 03/02/2022 reviewed interpret as clear lungs with hyperinflation on both the PA and lateral views.  On my review interpretation of prior most recent chest x-ray 2018 this is unchanged.    Questionaires / Pulmonary Flowsheets:    ACT:      No data to display          MMRC:     No data to display          Epworth:      No data to display          Tests:   FENO:  Lab Results  Component Value Date   NITRICOXIDE 18 06/03/2016    PFT:     No data to display          WALK:      No data to display          Imaging: Personally reviewed and as per EMR discussion this note No results found.  Lab Results: Personally reviewed CBC    Component Value Date/Time   WBC 5.4 08/10/2022 1434   RBC 4.68 08/10/2022 1434   HGB 12.2 08/10/2022 1434   HCT 38.1 08/10/2022 1434   PLT 304.0 08/10/2022 1434   MCV 81.4 08/10/2022 1434   MCHC 32.0 08/10/2022 1434   RDW 13.2 08/10/2022 1434   LYMPHSABS 1.5 08/10/2022 1434   MONOABS 0.5 08/10/2022 1434   EOSABS 0.1 08/10/2022 1434   BASOSABS 0.0 08/10/2022 1434    BMET No results found for: "NA", "K", "CL", "CO2", "GLUCOSE", "BUN", "CREATININE", "CALCIUM", "GFRNONAA", "GFRAA"  BNP No results found for: "BNP"  ProBNP No results found for: "PROBNP"  Specialty Problems  Pulmonary Problems   Chronic cough    No Known Allergies  Immunization History  Administered Date(s) Administered   Influenza, High Dose Seasonal PF 10/25/2015    History reviewed. No pertinent past medical history.  Tobacco History: Social History   Tobacco Use  Smoking Status Never  Smokeless Tobacco Never   Counseling given: Not Answered   Continue to not smoke  Outpatient Encounter Medications as of 11/28/2022  Medication Sig   ALPRAZolam (XANAX) 0.25 MG tablet Take 0.25 mg by mouth 2 (two) times daily as needed.   amLODipine (NORVASC) 10 MG tablet Take 10 mg by mouth daily.   Budeson-Glycopyrrol-Formoterol (BREZTRI AEROSPHERE) 160-9-4.8 MCG/ACT AERO Inhale 2 puffs into the lungs in the morning and at bedtime.   carvedilol (COREG) 12.5 MG tablet Take 12.5 mg by mouth 2 (two) times daily.   olmesartan (BENICAR) 40 MG tablet Take 40 mg by  mouth daily.   predniSONE (DELTASONE) 10 MG tablet Take 4 tablets (40 mg total) by mouth daily with breakfast for 7 days, THEN 2 tablets (20 mg total) daily with breakfast for 7 days, THEN 2 tablets (20 mg total) daily with breakfast for 7 days, THEN 1 tablet (10 mg total) daily with breakfast for 7 days.   zolpidem (AMBIEN) 10 MG tablet Take 10 mg by mouth at bedtime.   No facility-administered encounter medications on file as of 11/28/2022.     Review of Systems  Review of Systems  N/a Physical Exam  BP 134/75   Pulse 100   Ht 5\' 1"  (1.549 m)   Wt 115 lb (52.2 kg)   LMP 01/06/2011   SpO2 98%   BMI 21.73 kg/m   Wt Readings from Last 5 Encounters:  11/28/22 115 lb (52.2 kg)  08/10/22 124 lb (56.2 kg)  06/09/22 111 lb (50.3 kg)  05/13/22 113 lb (51.3 kg)  06/03/16 115 lb (52.2 kg)    BMI Readings from Last 5 Encounters:  11/28/22 21.73 kg/m  08/10/22 23.43 kg/m  06/09/22 20.97 kg/m  05/13/22 21.35 kg/m  06/03/16 21.73 kg/m     Physical Exam General: Sitting in chair, no acute distress Eyes: EOMI, no icterus Neck: Supple, no JVP Pulmonary: Clear, normal work of breathing Cardiovascular: Warm, no edema Abdomen: Nondistended, Bowel sounds present MSK: No synovitis, no joint effusion Neuro: Normal gait, no weakness Psych: Normal mood, full affect   Assessment & Plan:   Chronic cough/cough variant asthma: Present for months.  Following flu infection.  Did get better with initiation of PPI.  She has hyperinflated lungs as well.  High suspicion for reactive airways disease, likely worsened or triggered by viral illness, flu 12/2021.  Initially improved spontaneously.  Now returned.  No better despite trial of mid dose Advair discus.  Escalate to [facility] and improved after prednisone course.  Now returned despite good adherence to [facility].  Prolonged taper 40 for 7 days 20 for 7 days 10 for 7 days then stop.  If cough recurs or no prolonged remission, recommend  starting TEZSPIRE.  She expressed understanding  Hyperinflation on chest x-ray: Never smoker.  Suspect related to subclinical chronic asthma with flares of asthma symptoms with prolonged cough, prolonged bronchitis symptoms.  Return in about 3 months (around 02/28/2023) for f/u Dr. Judeth Horn.   Karren Burly, MD 11/28/2022

## 2023-02-09 DIAGNOSIS — G4700 Insomnia, unspecified: Secondary | ICD-10-CM | POA: Diagnosis not present

## 2023-02-09 DIAGNOSIS — R7303 Prediabetes: Secondary | ICD-10-CM | POA: Diagnosis not present

## 2023-02-09 DIAGNOSIS — E7800 Pure hypercholesterolemia, unspecified: Secondary | ICD-10-CM | POA: Diagnosis not present

## 2023-02-09 DIAGNOSIS — I10 Essential (primary) hypertension: Secondary | ICD-10-CM | POA: Diagnosis not present

## 2023-02-14 ENCOUNTER — Encounter: Payer: Self-pay | Admitting: Pulmonary Disease

## 2023-02-16 NOTE — Telephone Encounter (Signed)
Per OV 11/28/22: if cough comes back after prednisone and we need to think about starting a medicine called Tezspire -the injection for asthma and cough related to asthma.  We can start paperwork in the interim before your next visit if needed.   Please advise, thank you!

## 2023-02-17 ENCOUNTER — Telehealth: Payer: Self-pay | Admitting: Pharmacist

## 2023-02-17 NOTE — Telephone Encounter (Signed)
Patient will be new start to Tezspire per MyChart message that was routed to pharmacy team by Dr. Judeth Horn  Submitted a Prior Authorization request to [facility] for TEZSPIRE via CoverMyMeds. Will update once we receive a response.  Key: [id number]

## 2023-02-17 NOTE — Telephone Encounter (Signed)
Can we start process for Tezspire? Frequent exacerbations.

## 2023-02-23 MED ORDER — PREDNISONE 20 MG PO TABS: ORAL_TABLET | ORAL | 0 refills | Status: DC

## 2023-02-28 ENCOUNTER — Ambulatory Visit: Payer: BC Managed Care – PPO | Admitting: Pulmonary Disease

## 2023-02-28 ENCOUNTER — Encounter: Payer: Self-pay | Admitting: Pulmonary Disease

## 2023-02-28 VITALS — BP 134/78 | HR 106 | Ht 62.0 in | Wt 110.0 lb

## 2023-02-28 DIAGNOSIS — R053 Chronic cough: Secondary | ICD-10-CM | POA: Diagnosis not present

## 2023-02-28 DIAGNOSIS — R918 Other nonspecific abnormal finding of lung field: Secondary | ICD-10-CM | POA: Diagnosis not present

## 2023-02-28 MED ORDER — PREDNISONE 20 MG PO TABS: ORAL_TABLET | ORAL | 0 refills | Status: AC

## 2023-02-28 NOTE — Progress Notes (Signed)
 @Patient  ID: Sharon Boyer, female    DOB: 10/29/1965, 58 y.o.   MRN: [id number]  Chief Complaint  Patient presents with   Follow-up    Pt states she a lot better now, less than 1 week left on rx    Referring provider: Claudene Pellet, MD  HPI:   59 y.o. woman whom we are seeing in evaluation of chronic cough felt to be related to asthma.  Last seen 11/2022 with prednisone  taper given ongoing cough.  Markedly improved.  Then recurred 01/2023.  Prednisone  taper currently taking.  Marked improvement.  Continues Breztri .  Started the process for Tezspire.  HPI at initial visit: Previous history of chronic cough.  Does not seem like it lasted more in a couple months.  Some concern for reflux versus cyclical cough.  Advised voice rest etc.  Seem like they have gotten better.  Lasting in pulmonary clinic in 2018 and was an issue for many years.  She got flu December 2023.  Got better from acute setting send a few weeks later cough returns.  Largely dry.  No time of day when things are better or worse per the position to make things better or worse.  Tried multiple different things medication wise Tessalon Perles etc. cough syrup did not help.  Was placed on PPI and symptoms started to get better.  She is not sure if it was just getting better on its own it was got better with that.  She been off PPI for a few weeks and cough remains okay.  Has not used inhalers.  No prednisone .  Most recent chest x-ray 03/02/2022 reviewed interpret as clear lungs with hyperinflation on both the PA and lateral views.  On my review interpretation of prior most recent chest x-ray 2018 this is unchanged.    Questionaires / Pulmonary Flowsheets:   ACT:      No data to display          MMRC:     No data to display          Epworth:      No data to display          Tests:   FENO:  Lab Results  Component Value Date   NITRICOXIDE 18 06/03/2016    PFT:     No data to display           WALK:      No data to display          Imaging: Personally reviewed and as per EMR discussion this note No results found.  Lab Results: Personally reviewed CBC    Component Value Date/Time   WBC 5.4 08/10/2022 1434   RBC 4.68 08/10/2022 1434   HGB 12.2 08/10/2022 1434   HCT 38.1 08/10/2022 1434   PLT 304.0 08/10/2022 1434   MCV 81.4 08/10/2022 1434   MCHC 32.0 08/10/2022 1434   RDW 13.2 08/10/2022 1434   LYMPHSABS 1.5 08/10/2022 1434   MONOABS 0.5 08/10/2022 1434   EOSABS 0.1 08/10/2022 1434   BASOSABS 0.0 08/10/2022 1434    BMET No results found for: NA, K, CL, CO2, GLUCOSE, BUN, CREATININE, CALCIUM, GFRNONAA, GFRAA  BNP No results found for: BNP  ProBNP No results found for: PROBNP  Specialty Problems       Pulmonary Problems   Chronic cough    No Known Allergies  Immunization History  Administered Date(s) Administered   Influenza, High Dose Seasonal PF 10/25/2015    History  reviewed. No pertinent past medical history.  Tobacco History: Social History   Tobacco Use  Smoking Status Never  Smokeless Tobacco Never   Counseling given: Not Answered   Continue to not smoke  Outpatient Encounter Medications as of 02/28/2023  Medication Sig   ALPRAZolam (XANAX) 0.25 MG tablet Take 0.25 mg by mouth 2 (two) times daily as needed.   amLODipine (NORVASC) 10 MG tablet Take 10 mg by mouth daily.   Budeson-Glycopyrrol-Formoterol (BREZTRI  AEROSPHERE) 160-9-4.8 MCG/ACT AERO Inhale 2 puffs into the lungs in the morning and at bedtime.   carvedilol (COREG) 12.5 MG tablet Take 12.5 mg by mouth 2 (two) times daily.   olmesartan (BENICAR) 40 MG tablet Take 40 mg by mouth daily.   predniSONE  (DELTASONE ) 20 MG tablet Take 2 tablets (40 mg total) by mouth daily with breakfast for 5 days, THEN 1 tablet (20 mg total) daily with breakfast for 5 days.   zolpidem (AMBIEN) 10 MG tablet Take 10 mg by mouth at bedtime.   No  facility-administered encounter medications on file as of 02/28/2023.     Review of Systems  Review of Systems  N/a Physical Exam  BP 134/78 (BP Location: Left Arm, Patient Position: Sitting, Cuff Size: Normal)   Pulse (!) 106   Ht 5' 2 (1.575 m)   Wt 110 lb (49.9 kg)   LMP 01/06/2011   SpO2 100%   BMI 20.12 kg/m   Wt Readings from Last 5 Encounters:  02/28/23 110 lb (49.9 kg)  11/28/22 115 lb (52.2 kg)  08/10/22 124 lb (56.2 kg)  06/09/22 111 lb (50.3 kg)  05/13/22 113 lb (51.3 kg)    BMI Readings from Last 5 Encounters:  02/28/23 20.12 kg/m  11/28/22 21.73 kg/m  08/10/22 23.43 kg/m  06/09/22 20.97 kg/m  05/13/22 21.35 kg/m     Physical Exam General: Sitting in chair, no acute distress Eyes: EOMI, no icterus Neck: Supple, no JVP Pulmonary: Clear, normal work of breathing Cardiovascular: Warm, no edema Abdomen: Nondistended, Bowel sounds present MSK: No synovitis, no joint effusion Neuro: Normal gait, no weakness Psych: Normal mood, full affect   Assessment & Plan:   Chronic cough/cough variant asthma: Present for months.  Following flu infection.  Did get better with initiation of PPI.  She has hyperinflated lungs as well.  High suspicion for reactive airways disease, likely worsened or triggered by viral illness, flu 12/2021.  Initially improved spontaneously.  Now returned.  No better despite trial of mid dose Advair discus.  Escalated to Breztri  with recurrent exacerbations reliably response to prednisone .  Recommend Tezspire, in the process of insurance approval.  Continue current prednisone  course.  Course of prednisone  to have on hand in case of flares in the interim.  Hyperinflation on chest x-ray: Never smoker.  Suspect related to subclinical chronic asthma with flares of asthma symptoms with prolonged cough, prolonged bronchitis symptoms.  Return in about 3 months (around 05/28/2023).   Donnice JONELLE Beals, MD 02/28/2023

## 2023-02-28 NOTE — Patient Instructions (Signed)
 Nice to see you again  Continue Breztri  2 puffs twice a day, you should have refills through May  Ted Spiriva should be approved soon, we will be in touch to schedule your first injection in the office with our pharmacist, deftly.  Return to clinic in 3 months or sooner as needed with Dr. Annella

## 2023-03-31 ENCOUNTER — Other Ambulatory Visit (HOSPITAL_COMMUNITY): Payer: Self-pay

## 2023-03-31 NOTE — Telephone Encounter (Signed)
 Submitted a Prior Authorization request to [facility] for TEZSPIRE via fax. Will update once we receive a response.  Fax: [phone] Phone: [phone] PA # [id number]  Chesley Mires, PharmD, MPH, BCPS, CPP Clinical Pharmacist (Rheumatology and Pulmonology)

## 2023-03-31 NOTE — Telephone Encounter (Signed)
 Per response on CMM:  Please advise the dispensing pharmacy to contact the Pharmacy Help Line at [phone] for assistance.  Test claim:   Called [facility] PA line for specialty medications for PA request for Tezspire. Rep will fax PA form to clinic  PA # [phone] Phone: [phone]  Chesley Mires, PharmD, MPH, BCPS, CPP Clinical Pharmacist (Rheumatology and Pulmonology)

## 2023-04-05 DIAGNOSIS — J301 Allergic rhinitis due to pollen: Secondary | ICD-10-CM | POA: Diagnosis not present

## 2023-04-05 DIAGNOSIS — J3081 Allergic rhinitis due to animal (cat) (dog) hair and dander: Secondary | ICD-10-CM | POA: Diagnosis not present

## 2023-04-05 DIAGNOSIS — R052 Subacute cough: Secondary | ICD-10-CM | POA: Diagnosis not present

## 2023-04-05 DIAGNOSIS — J3089 Other allergic rhinitis: Secondary | ICD-10-CM | POA: Diagnosis not present

## 2023-04-06 ENCOUNTER — Other Ambulatory Visit (HOSPITAL_COMMUNITY): Payer: Self-pay

## 2023-04-06 NOTE — Telephone Encounter (Signed)
 Received notification from [facility] regarding a prior authorization for TEZSPIRE. Authorization has been APPROVED from 04/02/2023 to 09/29/2023. Approval letter sent to scan center.  Patient must fill through [facility]: [phone]. Patient is [employer] eligible  Authorization # [id number]  Enrolled patient into Tezspire copay card:  ID: [id number] Group: [id number] BIN: [id number] PCN: [id number] Exp: April 05, 2026 Copay program phone: [phone]  ATC patient to schedule Tezspire new start. Unable to reach. Left VM requesting return call  Chesley Mires, PharmD, MPH, BCPS, CPP Clinical Pharmacist (Rheumatology and Pulmonology)

## 2023-04-07 ENCOUNTER — Other Ambulatory Visit: Payer: Self-pay | Admitting: Allergy

## 2023-04-07 ENCOUNTER — Ambulatory Visit
Admission: RE | Admit: 2023-04-07 | Discharge: 2023-04-07 | Disposition: A | Discharge status: Home / Self Care | Source: Ambulatory Visit | Attending: Allergy | Admitting: Allergy

## 2023-04-07 DIAGNOSIS — R052 Subacute cough: Secondary | ICD-10-CM

## 2023-04-07 DIAGNOSIS — R059 Cough, unspecified: Secondary | ICD-10-CM | POA: Diagnosis not present

## 2023-04-10 NOTE — Telephone Encounter (Signed)
 ATC #2 to schedule Tezspire new start appointment. Unable to reach. Left VM requesting return call.  Chesley Mires, PharmD, MPH, BCPS, CPP Clinical Pharmacist (Rheumatology and Pulmonology)

## 2023-04-12 NOTE — Telephone Encounter (Signed)
 ATC patient regarding Tezspire new start. Unable to reach. Left VM requesting return call.  Letter sent. If no response by 04/26/2023 - will close encounter.  Chesley Mires, PharmD, MPH, BCPS, CPP Clinical Pharmacist (Rheumatology and Pulmonology)

## 2023-05-25 DIAGNOSIS — R600 Localized edema: Secondary | ICD-10-CM | POA: Diagnosis not present

## 2023-05-25 DIAGNOSIS — R252 Cramp and spasm: Secondary | ICD-10-CM | POA: Diagnosis not present

## 2023-05-31 ENCOUNTER — Encounter: Payer: Self-pay | Admitting: Pulmonary Disease

## 2023-05-31 ENCOUNTER — Ambulatory Visit: Payer: BC Managed Care – PPO | Admitting: Pulmonary Disease

## 2023-05-31 VITALS — BP 122/74 | HR 98 | Temp 98.3°F | Ht 61.0 in | Wt 123.8 lb

## 2023-05-31 DIAGNOSIS — J4550 Severe persistent asthma, uncomplicated: Secondary | ICD-10-CM

## 2023-05-31 DIAGNOSIS — J45991 Cough variant asthma: Secondary | ICD-10-CM | POA: Diagnosis not present

## 2023-05-31 DIAGNOSIS — R053 Chronic cough: Secondary | ICD-10-CM

## 2023-05-31 MED ORDER — BREZTRI AEROSPHERE 160-9-4.8 MCG/ACT IN AERO: 2.0000 | INHALATION_SPRAY | Freq: Two times a day (BID) | RESPIRATORY_TRACT | 12 refills | Status: AC

## 2023-05-31 MED ORDER — BREZTRI AEROSPHERE 160-9-4.8 MCG/ACT IN AERO: 2.0000 | INHALATION_SPRAY | Freq: Two times a day (BID) | RESPIRATORY_TRACT | Status: DC

## 2023-05-31 NOTE — Progress Notes (Signed)
 @Patient  ID: Sharon Boyer, female    DOB: Mar 11, 1965, 58 y.o.   MRN: [id number]  Chief Complaint  Patient presents with   Follow-up    Doing well today.    Referring provider: Faustina Hood, MD  HPI:   58 y.o. woman whom we are seeing in evaluation of chronic cough felt to be related to asthma.  Multiple telephone encounters from pulmonary pharmacist reviewed.  Returns for routine follow-up.  We discussed starting Tezspire at last visit.  This was never started.  She continues Breztri .  Another exacerbation in the interim 03/2023.  Chest x-ray clear on my review interpretation.  Got better with steroids.  We discussed again biologic therapy.  HPI at initial visit: Previous history of chronic cough.  Does not seem like it lasted more in a couple months.  Some concern for reflux versus cyclical cough.  Advised voice rest etc.  Seem like they have gotten better.  Lasting in pulmonary clinic in 2018 and was an issue for many years.  She got flu December 2023.  Got better from acute setting send a few weeks later cough returns.  Largely dry.  No time of day when things are better or worse per the position to make things better or worse.  Tried multiple different things medication wise Tessalon Perles etc. cough syrup did not help.  Was placed on PPI and symptoms started to get better.  She is not sure if it was just getting better on its own it was got better with that.  She been off PPI for a few weeks and cough remains okay.  Has not used inhalers.  No prednisone .  Most recent chest x-ray 03/02/2022 reviewed interpret as clear lungs with hyperinflation on both the PA and lateral views.  On my review interpretation of prior most recent chest x-ray 2018 this is unchanged.    Questionaires / Pulmonary Flowsheets:   ACT:      No data to display          MMRC:     No data to display          Epworth:      No data to display          Tests:   FENO:  Lab Results   Component Value Date   NITRICOXIDE 18 06/03/2016    PFT:     No data to display          WALK:      No data to display          Imaging: Personally reviewed and as per EMR discussion this note No results found.  Lab Results: Personally reviewed CBC    Component Value Date/Time   WBC 5.4 08/10/2022 1434   RBC 4.68 08/10/2022 1434   HGB 12.2 08/10/2022 1434   HCT 38.1 08/10/2022 1434   PLT 304.0 08/10/2022 1434   MCV 81.4 08/10/2022 1434   MCHC 32.0 08/10/2022 1434   RDW 13.2 08/10/2022 1434   LYMPHSABS 1.5 08/10/2022 1434   MONOABS 0.5 08/10/2022 1434   EOSABS 0.1 08/10/2022 1434   BASOSABS 0.0 08/10/2022 1434    BMET No results found for: "NA", "K", "CL", "CO2", "GLUCOSE", "BUN", "CREATININE", "CALCIUM", "GFRNONAA", "GFRAA"  BNP No results found for: "BNP"  ProBNP No results found for: "PROBNP"  Specialty Problems       Pulmonary Problems   Chronic cough    Allergies  Allergen Reactions   Diphenhydramine  Other Reaction(s): feels jittery   Minocycline Rash    Immunization History  Administered Date(s) Administered   Influenza, High Dose Seasonal PF 10/25/2015    History reviewed. No pertinent past medical history.  Tobacco History: Social History   Tobacco Use  Smoking Status Never  Smokeless Tobacco Never   Counseling given: Not Answered   Continue to not smoke  Outpatient Encounter Medications as of 05/31/2023  Medication Sig   albuterol (VENTOLIN HFA) 108 (90 Base) MCG/ACT inhaler Inhale 2 puffs into the lungs every 4 (four) hours as needed for wheezing or shortness of breath.   ALPRAZolam (XANAX) 0.25 MG tablet Take 0.25 mg by mouth 2 (two) times daily as needed.   amLODipine (NORVASC) 10 MG tablet Take 10 mg by mouth daily.   budeson-glycopyrrolate-formoterol (BREZTRI  AEROSPHERE) 160-9-4.8 MCG/ACT AERO inhaler Inhale 2 puffs into the lungs in the morning and at bedtime.   carvedilol (COREG) 12.5 MG tablet Take 12.5 mg  by mouth 2 (two) times daily.   olmesartan (BENICAR) 40 MG tablet Take 40 mg by mouth daily.   zolpidem (AMBIEN) 10 MG tablet Take 10 mg by mouth at bedtime.   budeson-glycopyrrolate-formoterol (BREZTRI  AEROSPHERE) 160-9-4.8 MCG/ACT AERO inhaler Inhale 2 puffs into the lungs in the morning and at bedtime.   [DISCONTINUED] Budeson-Glycopyrrol-Formoterol (BREZTRI  AEROSPHERE) 160-9-4.8 MCG/ACT AERO Inhale 2 puffs into the lungs in the morning and at bedtime.   [DISCONTINUED] WIXELA INHUB 250-50 MCG/ACT AEPB Inhale 1 puff into the lungs 2 (two) times daily. (Patient not taking: Reported on 05/31/2023)   No facility-administered encounter medications on file as of 05/31/2023.     Review of Systems  Review of Systems  N/a Physical Exam  BP 122/74 (BP Location: Left Arm, Patient Position: Sitting, Cuff Size: Normal)   Pulse 98   Temp 98.3 F (36.8 C) (Oral)   Ht 5\' 1"  (1.549 m)   Wt 123 lb 12.8 oz (56.2 kg)   LMP 01/06/2011   SpO2 98%   BMI 23.39 kg/m   Wt Readings from Last 5 Encounters:  05/31/23 123 lb 12.8 oz (56.2 kg)  02/28/23 110 lb (49.9 kg)  11/28/22 115 lb (52.2 kg)  08/10/22 124 lb (56.2 kg)  06/09/22 111 lb (50.3 kg)    BMI Readings from Last 5 Encounters:  05/31/23 23.39 kg/m  02/28/23 20.12 kg/m  11/28/22 21.73 kg/m  08/10/22 23.43 kg/m  06/09/22 20.97 kg/m     Physical Exam General: Sitting in chair, no acute distress Eyes: EOMI, no icterus Neck: Supple, no JVP Pulmonary: Clear, normal work of breathing Cardiovascular: Warm, no edema Abdomen: Nondistended, Bowel sounds present MSK: No synovitis, no joint effusion Neuro: Normal gait, no weakness Psych: Normal mood, full affect   Assessment & Plan:   Chronic cough/cough variant asthma: Present for months.  Following flu infection.  Did get better with initiation of PPI.  She has hyperinflated lungs as well.  High suspicion for reactive airways disease, likely worsened or triggered by viral illness,  flu 12/2021.  Initially improved spontaneously.  Now returned.  No better despite trial of mid dose Advair discus.  Escalated to Breztri  with recurrent exacerbations reliably responds well to prednisone .  Breztri .  Have recommended Tezspire, we were in the process of starting this and it was approved.  She is precontemplative.  Instructed her to contact us  so we can start this when she is ready.  She has needed additional and steroids in the interim and we discussed at length the risk of repetitive  steroid courses.  Hyperinflation on chest x-ray: Never smoker.  Suspect related to subclinical chronic asthma with flares of asthma symptoms with prolonged cough, prolonged bronchitis symptoms.  Return in about 6 months (around 12/01/2023) for f/u Dr. Marygrace Snellen.   Guerry Leek, MD 05/31/2023

## 2023-05-31 NOTE — Patient Instructions (Signed)
 Continue Breztri  as you are, refilled today  The medication we talked about using, the injection, is Tezspire.  If you are ready to move forward with this in the future please send us  a message and we can get the ball rolling.  Return to clinic in 6 months or sooner as needed with Dr. Marygrace Snellen

## 2023-06-28 ENCOUNTER — Ambulatory Visit: Payer: Self-pay

## 2023-06-28 NOTE — Telephone Encounter (Signed)
  FYI Only or Action Required?: FYI only for provider  Patient was last seen in primary care on 05/31/23 w/ Dr. Marygrace Snellen. Called Nurse Triage reporting Cough. Symptoms began several days ago. Interventions attempted: Nothing. Symptoms are: stable.  Triage Disposition: No disposition on file. - Pt scheduled. Requesting med that was discussed from last OV.   Patient/caregiver understands and will follow disposition?: Yes                    Answer Assessment - Initial Assessment Questions 1. ONSET: "When did the cough begin?"      Last week     2. SEVERITY: "How bad is the cough today?"      ------------------ Bothersome. Keeps her up at night     3. SPUTUM: "Describe the color of your sputum" (none, dry cough; clear, white, yellow, green)     ----------------- Denies    4. HEMOPTYSIS: "Are you coughing up any blood?" If so ask: "How much?" (flecks, streaks, tablespoons, etc.)     ----------------------- Denies  5. DIFFICULTY BREATHING: "Are you having difficulty breathing?" If Yes, ask: "How bad is it?" (e.g., mild, moderate, severe)    - MILD: No SOB at rest, mild SOB with walking, speaks normally in sentences, can lie down, no retractions, pulse < 100.    - MODERATE: SOB at rest, SOB with minimal exertion and prefers to sit, cannot lie down flat, speaks in phrases, mild retractions, audible wheezing, pulse 100-120.    - SEVERE: Very SOB at rest, speaks in single words, struggling to breathe, sitting hunched forward, retractions, pulse > 120      -------------------------Denies     6. FEVER: "Do you have a fever?" If Yes, ask: "What is your temperature, how was it measured, and when did it start?"     --------------- Denies  7. CARDIAC HISTORY: "Do you have any history of heart disease?" (e.g., heart attack, congestive heart failure)      ------------Denies  8. LUNG HISTORY: "Do you have any history of lung disease?"  (e.g., pulmonary embolus, asthma,  emphysema)     -------------- Denies   10. OTHER SYMPTOMS: "Do you have any other symptoms?" (e.g., runny nose, wheezing, chest pain)       ----Denies      Additional Info: -Was given prednisone - finished the course a few months ago- Had relief from that.  Was told about about a shot Tezspire and to call back about prednsone  Protocols used: Cough - Chronic-A-AH

## 2023-06-29 ENCOUNTER — Telehealth: Payer: Self-pay | Admitting: Primary Care

## 2023-06-29 ENCOUNTER — Encounter: Admitting: Primary Care

## 2023-06-29 NOTE — Telephone Encounter (Signed)
 Appointment cancelled for today. Attempted to call patient to get her scheduled. Left voicemail.

## 2023-06-29 NOTE — Telephone Encounter (Signed)
 Patient was unable to login to video visit, please call patient and schedule in person visit for cough with Dr. Marygrace Snellen or APP. Cancel today's visit

## 2023-06-30 NOTE — Telephone Encounter (Signed)
 Patient has been scheduled

## 2023-08-24 ENCOUNTER — Ambulatory Visit (HOSPITAL_BASED_OUTPATIENT_CLINIC_OR_DEPARTMENT_OTHER): Admitting: Nurse Practitioner

## 2023-08-24 DIAGNOSIS — Z0000 Encounter for general adult medical examination without abnormal findings: Secondary | ICD-10-CM | POA: Diagnosis not present

## 2023-08-24 DIAGNOSIS — E7800 Pure hypercholesterolemia, unspecified: Secondary | ICD-10-CM | POA: Diagnosis not present

## 2023-08-24 DIAGNOSIS — R7303 Prediabetes: Secondary | ICD-10-CM | POA: Diagnosis not present

## 2023-08-24 DIAGNOSIS — G4700 Insomnia, unspecified: Secondary | ICD-10-CM | POA: Diagnosis not present

## 2023-08-24 DIAGNOSIS — I10 Essential (primary) hypertension: Secondary | ICD-10-CM | POA: Diagnosis not present

## 2023-08-29 NOTE — Progress Notes (Signed)
 This encounter was created in error - please disregard.

## 2023-09-11 ENCOUNTER — Other Ambulatory Visit (HOSPITAL_COMMUNITY): Payer: Self-pay

## 2023-09-11 ENCOUNTER — Ambulatory Visit (INDEPENDENT_AMBULATORY_CARE_PROVIDER_SITE_OTHER): Admitting: Pulmonary Disease

## 2023-09-11 ENCOUNTER — Telehealth: Payer: Self-pay

## 2023-09-11 ENCOUNTER — Encounter: Payer: Self-pay | Admitting: Pulmonary Disease

## 2023-09-11 VITALS — BP 130/79 | HR 88 | Temp 98.2°F | Ht 61.0 in | Wt 124.8 lb

## 2023-09-11 DIAGNOSIS — R053 Chronic cough: Secondary | ICD-10-CM | POA: Diagnosis not present

## 2023-09-11 DIAGNOSIS — J4550 Severe persistent asthma, uncomplicated: Secondary | ICD-10-CM

## 2023-09-11 MED ORDER — ALBUTEROL SULFATE HFA 108 (90 BASE) MCG/ACT IN AERS
2.0000 | INHALATION_SPRAY | Freq: Four times a day (QID) | RESPIRATORY_TRACT | 6 refills | Status: AC | PRN
Start: 2023-09-11 — End: ?

## 2023-09-11 MED ORDER — ALBUTEROL SULFATE HFA 108 (90 BASE) MCG/ACT IN AERS: 2.0000 | INHALATION_SPRAY | RESPIRATORY_TRACT | 6 refills | Status: AC | PRN

## 2023-09-11 NOTE — Telephone Encounter (Signed)
 I am ok to change to Albuterol  HFA 8.5gm - can you please send 2 puff q6 hrs PRN, qty 1 refill 6. I could not find the specific order requested.

## 2023-09-11 NOTE — Telephone Encounter (Signed)
Please advise on med change or PA.

## 2023-09-11 NOTE — Patient Instructions (Signed)
 Nice to see you again  I refilled the albuterol   Continue Breztri  2 puffs twice a day, rinse mouth after every use  Return to clinic in 6 months or sooner as needed with Dr. Annella

## 2023-09-11 NOTE — Progress Notes (Signed)
 @Patient  ID: Darice Marko Ruiz, female    DOB: 02-17-1965, 58 y.o.   MRN: [id number]  Chief Complaint  Patient presents with   Follow-up    Chronic cough f/u    Referring provider: Claudene Pellet, MD  HPI:   58 y.o. woman whom we are seeing in evaluation of chronic cough felt to be related to asthma.  Multiple telephone encounters from this office reviewed.  Returns for routine follow-up.  Overall doing well.  Cough little bit better.  No exacerbations in interim.  Good adherence to Breztri .  Rare albuterol  use.  HPI at initial visit: Previous history of chronic cough.  Does not seem like it lasted more in a couple months.  Some concern for reflux versus cyclical cough.  Advised voice rest etc.  Seem like they have gotten better.  Lasting in pulmonary clinic in 2018 and was an issue for many years.  She got flu December 2023.  Got better from acute setting send a few weeks later cough returns.  Largely dry.  No time of day when things are better or worse per the position to make things better or worse.  Tried multiple different things medication wise Tessalon Perles etc. cough syrup did not help.  Was placed on PPI and symptoms started to get better.  She is not sure if it was just getting better on its own it was got better with that.  She been off PPI for a few weeks and cough remains okay.  Has not used inhalers.  No prednisone .  Most recent chest x-ray 03/02/2022 reviewed interpret as clear lungs with hyperinflation on both the PA and lateral views.  On my review interpretation of prior most recent chest x-ray 2018 this is unchanged.    Questionaires / Pulmonary Flowsheets:   ACT:      No data to display          MMRC:     No data to display          Epworth:      No data to display          Tests:   FENO:  Lab Results  Component Value Date   NITRICOXIDE 18 06/03/2016    PFT:     No data to display          WALK:      No data to display           Imaging: Personally reviewed and as per EMR discussion this note No results found.  Lab Results: Personally reviewed CBC    Component Value Date/Time   WBC 5.4 08/10/2022 1434   RBC 4.68 08/10/2022 1434   HGB 12.2 08/10/2022 1434   HCT 38.1 08/10/2022 1434   PLT 304.0 08/10/2022 1434   MCV 81.4 08/10/2022 1434   MCHC 32.0 08/10/2022 1434   RDW 13.2 08/10/2022 1434   LYMPHSABS 1.5 08/10/2022 1434   MONOABS 0.5 08/10/2022 1434   EOSABS 0.1 08/10/2022 1434   BASOSABS 0.0 08/10/2022 1434    BMET No results found for: NA, K, CL, CO2, GLUCOSE, BUN, CREATININE, CALCIUM, GFRNONAA, GFRAA  BNP No results found for: BNP  ProBNP No results found for: PROBNP  Specialty Problems       Pulmonary Problems   Chronic cough    Allergies  Allergen Reactions   Diphenhydramine     Other Reaction(s): feels jittery   Minocycline Rash    Immunization History  Administered Date(s) Administered   Influenza,  High Dose Seasonal PF 10/25/2015    History reviewed. No pertinent past medical history.  Tobacco History: Social History   Tobacco Use  Smoking Status Never  Smokeless Tobacco Never   Counseling given: Not Answered   Continue to not smoke  Outpatient Encounter Medications as of 09/11/2023  Medication Sig   ALPRAZolam (XANAX) 0.25 MG tablet Take 0.25 mg by mouth 2 (two) times daily as needed.   amLODipine (NORVASC) 10 MG tablet Take 10 mg by mouth daily.   budeson-glycopyrrolate-formoterol (BREZTRI  AEROSPHERE) 160-9-4.8 MCG/ACT AERO inhaler Inhale 2 puffs into the lungs in the morning and at bedtime.   carvedilol (COREG) 12.5 MG tablet Take 12.5 mg by mouth 2 (two) times daily.   olmesartan (BENICAR) 40 MG tablet Take 40 mg by mouth daily.   zolpidem (AMBIEN) 10 MG tablet Take 10 mg by mouth at bedtime.   [DISCONTINUED] albuterol  (VENTOLIN  HFA) 108 (90 Base) MCG/ACT inhaler Inhale 2 puffs into the lungs every 4 (four) hours as needed  for wheezing or shortness of breath.   [DISCONTINUED] budeson-glycopyrrolate-formoterol (BREZTRI  AEROSPHERE) 160-9-4.8 MCG/ACT AERO inhaler Inhale 2 puffs into the lungs in the morning and at bedtime.   albuterol  (VENTOLIN  HFA) 108 (90 Base) MCG/ACT inhaler Inhale 2 puffs into the lungs every 4 (four) hours as needed for wheezing or shortness of breath.   No facility-administered encounter medications on file as of 09/11/2023.     Review of Systems  Review of Systems  N/a Physical Exam  BP 130/79   Pulse 88   Temp 98.2 F (36.8 C)   Ht 5' 1 (1.549 m)   Wt 124 lb 12.8 oz (56.6 kg)   LMP 01/06/2011   SpO2 100% Comment: RA  BMI 23.58 kg/m   Wt Readings from Last 5 Encounters:  09/11/23 124 lb 12.8 oz (56.6 kg)  05/31/23 123 lb 12.8 oz (56.2 kg)  02/28/23 110 lb (49.9 kg)  11/28/22 115 lb (52.2 kg)  08/10/22 124 lb (56.2 kg)    BMI Readings from Last 5 Encounters:  09/11/23 23.58 kg/m  05/31/23 23.39 kg/m  02/28/23 20.12 kg/m  11/28/22 21.73 kg/m  08/10/22 23.43 kg/m     Physical Exam General: Sitting in chair, no acute distress Eyes: EOMI, no icterus Neck: Supple, no JVP Pulmonary: Clear, normal work of breathing Cardiovascular: Warm, no edema Abdomen: Nondistended, Bowel sounds present MSK: No synovitis, no joint effusion Neuro: Normal gait, no weakness Psych: Normal mood, full affect   Assessment & Plan:   Chronic cough/cough variant asthma: Present for months.  Following flu infection.  Did get better with initiation of PPI.  She has hyperinflated lungs as well.  High suspicion for reactive airways disease, likely worsened or triggered by viral illness, flu 12/2021.  Initially improved spontaneously.  Now returned.  No better despite trial of mid dose Advair discus.  Escalated to Breztri  with recurrent exacerbations reliably responds well to prednisone .  Overall improved with Breztri .  We have discussed Tezspire given lack of targets on phenotyping.   Fortunately, her symptoms are currently well-controlled.  We discussed reconsidering biologic therapy if recurrent exacerbations, bronchitis were to recur.  Hyperinflation on chest x-ray: Never smoker.  Suspect related to subclinical chronic asthma with flares of asthma symptoms with prolonged cough, prolonged bronchitis symptoms.  Return in about 6 months (around 03/13/2024) for f/u Dr. Annella.   Donnice JONELLE Annella, MD 09/11/2023

## 2023-09-11 NOTE — Telephone Encounter (Signed)
*  Pulm  Pharmacy Patient Advocate Encounter   Received notification from CoverMyMeds that prior authorization for Albuterol  Sulfate HFA 108 (90 Base)MCG/ACT aerosol  is required/requested.   Insurance verification completed.   The patient is insured through [facility] .   Per test claim:  Albuterol  HFA 8.5gm is preferred by the insurance.  If suggested medication is appropriate, Please send in a new RX and discontinue this one. If not, please advise as to why it's not appropriate so that we may request a Prior Authorization. Please note, some preferred medications may still require a PA.  If the suggested medications have not been trialed and there are no contraindications to their use, the PA will not be submitted, as it will not be approved.   Albuterol  HFA 8.5mg - $16.00  CMM Key: [id number]

## 2023-09-11 NOTE — Telephone Encounter (Signed)
 I can't find that one either so I had Dr Kassie look at it with me and we made the quantity 8.5gm tube to see if this corrects it. We are thinking this maybe what they are referring to

## 2023-09-29 DIAGNOSIS — J301 Allergic rhinitis due to pollen: Secondary | ICD-10-CM | POA: Diagnosis not present

## 2023-09-29 DIAGNOSIS — J3081 Allergic rhinitis due to animal (cat) (dog) hair and dander: Secondary | ICD-10-CM | POA: Diagnosis not present

## 2023-09-29 DIAGNOSIS — J3089 Other allergic rhinitis: Secondary | ICD-10-CM | POA: Diagnosis not present

## 2023-09-29 DIAGNOSIS — R052 Subacute cough: Secondary | ICD-10-CM | POA: Diagnosis not present

## 2023-10-29 DIAGNOSIS — H8111 Benign paroxysmal vertigo, right ear: Secondary | ICD-10-CM | POA: Diagnosis not present

## 2023-10-29 DIAGNOSIS — H6691 Otitis media, unspecified, right ear: Secondary | ICD-10-CM | POA: Diagnosis not present

## 2023-12-04 DIAGNOSIS — R0789 Other chest pain: Secondary | ICD-10-CM | POA: Diagnosis not present

## 2023-12-04 DIAGNOSIS — R059 Cough, unspecified: Secondary | ICD-10-CM | POA: Diagnosis not present

## 2023-12-04 DIAGNOSIS — I10 Essential (primary) hypertension: Secondary | ICD-10-CM | POA: Diagnosis not present

## 2023-12-14 ENCOUNTER — Ambulatory Visit
Admission: RE | Admit: 2023-12-14 | Discharge: 2023-12-14 | Disposition: A | Discharge status: Home / Self Care | Source: Ambulatory Visit | Attending: Family Medicine | Admitting: Family Medicine

## 2023-12-14 ENCOUNTER — Other Ambulatory Visit: Payer: Self-pay | Admitting: Family Medicine

## 2023-12-14 DIAGNOSIS — Z1231 Encounter for screening mammogram for malignant neoplasm of breast: Secondary | ICD-10-CM

## 2023-12-19 ENCOUNTER — Ambulatory Visit

## 2023-12-28 DIAGNOSIS — I10 Essential (primary) hypertension: Secondary | ICD-10-CM | POA: Diagnosis not present

## 2023-12-28 DIAGNOSIS — R053 Chronic cough: Secondary | ICD-10-CM | POA: Diagnosis not present

## 2024-05-21 ENCOUNTER — Ambulatory Visit: Admitting: Pulmonary Disease
